# Patient Record
Sex: Male | Born: 1965 | Race: Black or African American | Hispanic: No | Marital: Married | Smoking: Never smoker
Health system: Southern US, Community
[De-identification: ages and names within clinical notes are randomized; demographics above are authoritative.]

## PROBLEM LIST (undated history)

## (undated) DIAGNOSIS — F419 Anxiety disorder, unspecified: Secondary | ICD-10-CM

## (undated) DIAGNOSIS — N400 Enlarged prostate without lower urinary tract symptoms: Secondary | ICD-10-CM

## (undated) DIAGNOSIS — T7840XA Allergy, unspecified, initial encounter: Secondary | ICD-10-CM

## (undated) DIAGNOSIS — G43909 Migraine, unspecified, not intractable, without status migrainosus: Secondary | ICD-10-CM

## (undated) DIAGNOSIS — K219 Gastro-esophageal reflux disease without esophagitis: Secondary | ICD-10-CM

## (undated) HISTORY — DX: Anxiety disorder, unspecified: F41.9

## (undated) HISTORY — DX: Allergy, unspecified, initial encounter: T78.40XA

## (undated) HISTORY — PX: OTHER SURGICAL HISTORY: SHX169

## (undated) HISTORY — DX: Gastro-esophageal reflux disease without esophagitis: K21.9

---

## 2010-01-18 ENCOUNTER — Emergency Department (HOSPITAL_COMMUNITY): Admission: EM | Admit: 2010-01-18 | Discharge: 2010-01-18 | Payer: Self-pay | Admitting: Emergency Medicine

## 2010-01-18 IMAGING — US US ABDOMEN COMPLETE
1 series · 13 of 25 positions shown · non-contrast
Comparison: None.

CLINICAL DATA: Right upper quadrant and epigastric abdominal pain.

COMPLETE ABDOMINAL ULTRASOUND [DATE]:

[Series 1: us abdomen complete · 0.32mm/px · 13 of 84 slices shown]
[im 1/84]
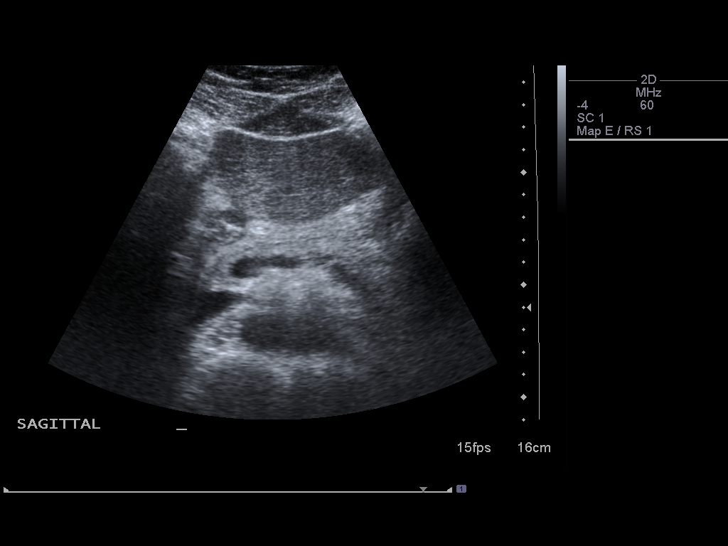
[im 7/84]
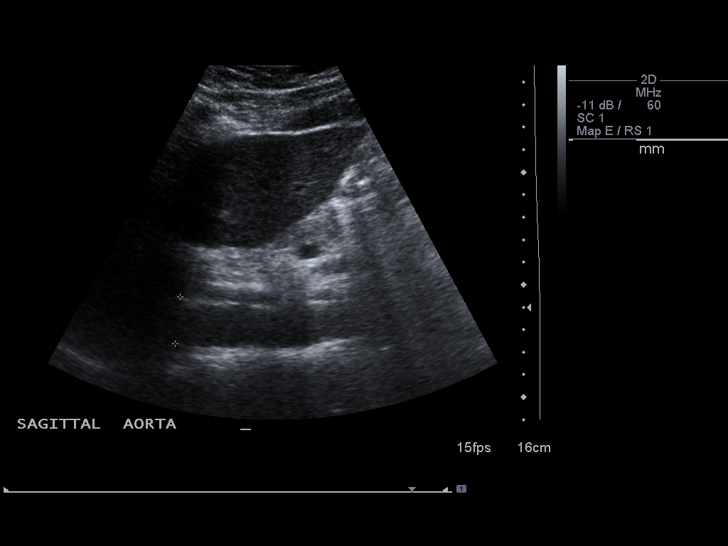
[im 14/84]
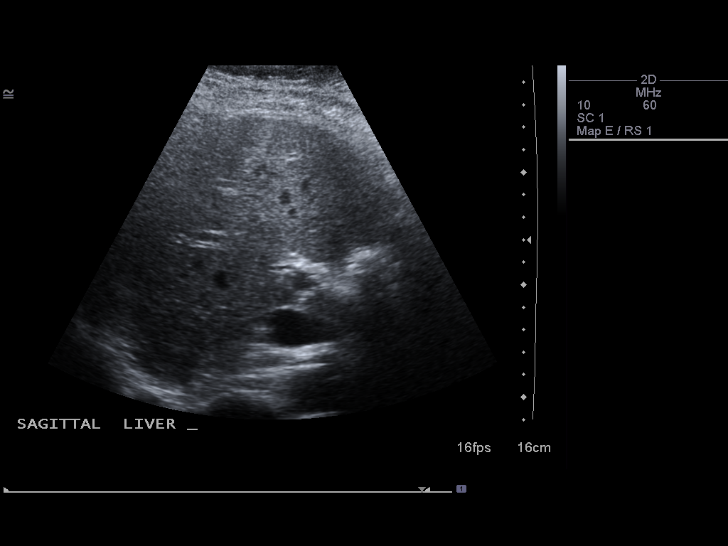
[im 21/84]
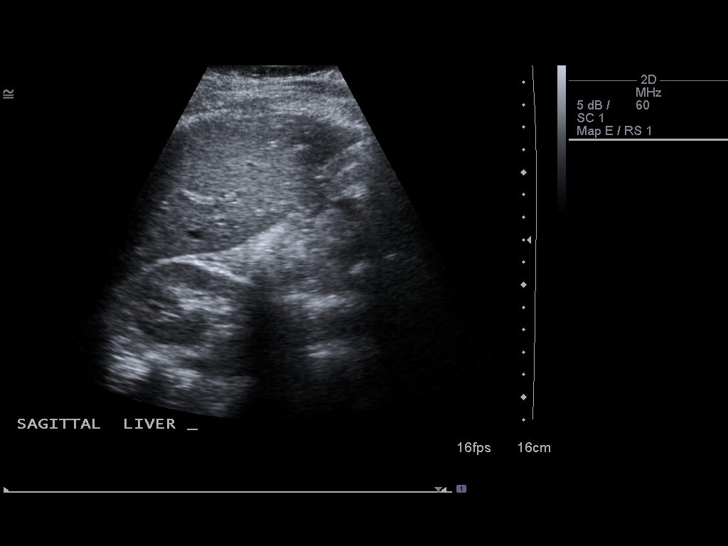
[im 28/84]
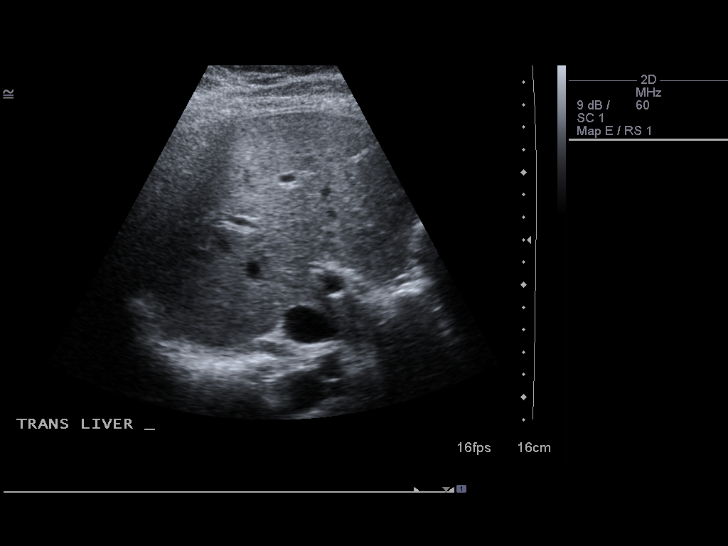
[im 35/84]
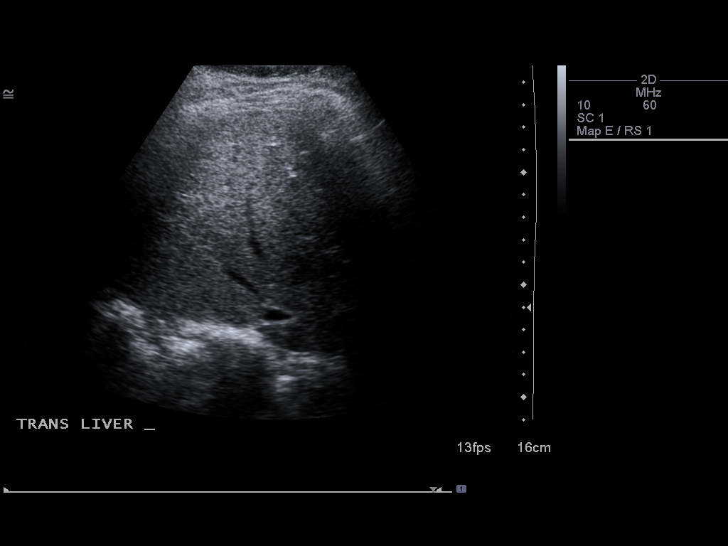
[im 42/84]
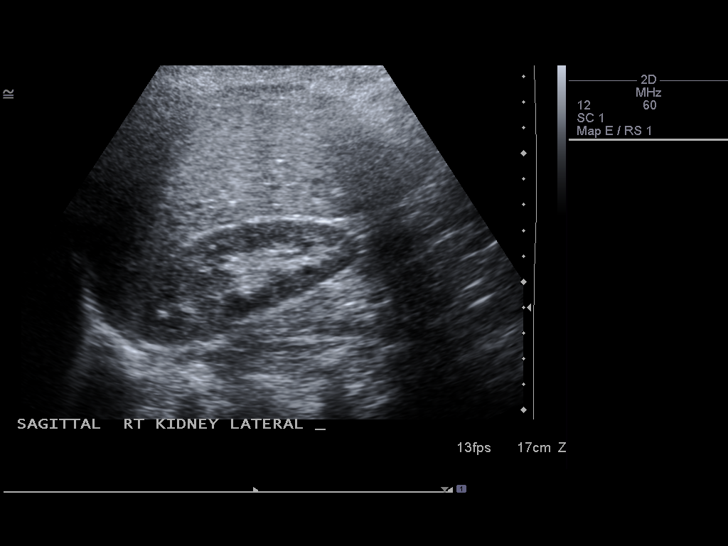
[im 49/84]
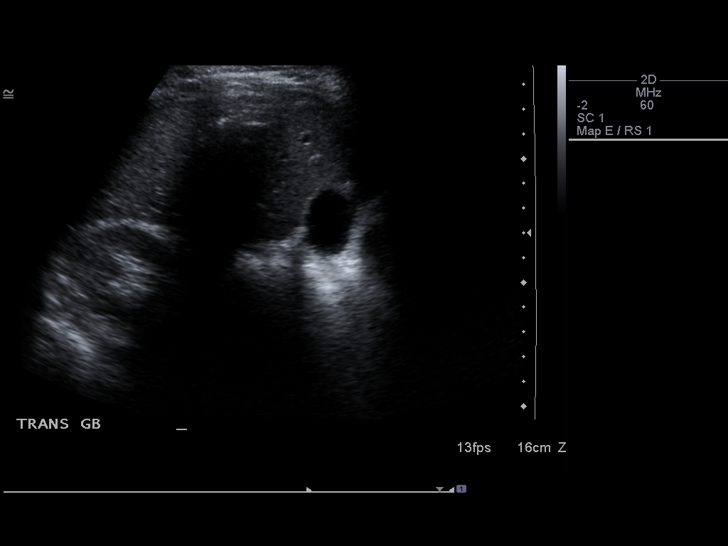
[im 56/84]
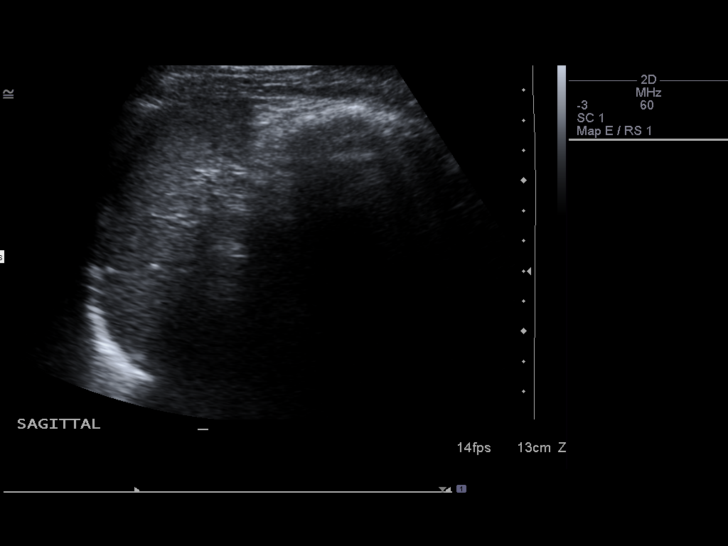
[im 63/84]
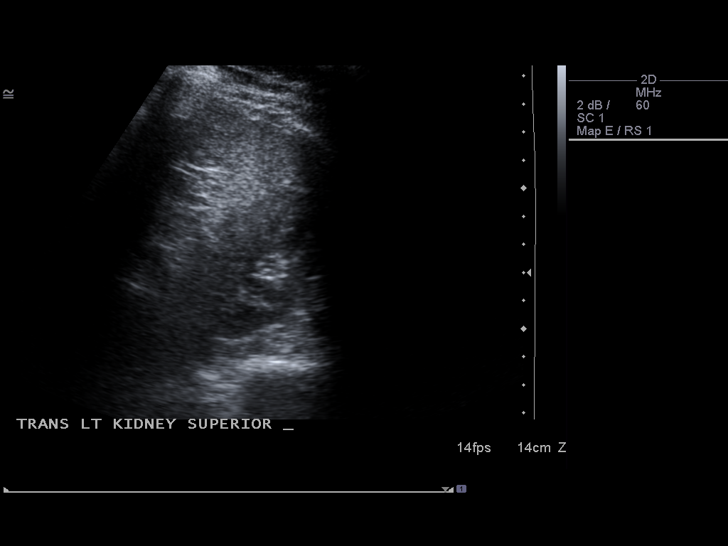
[im 70/84]
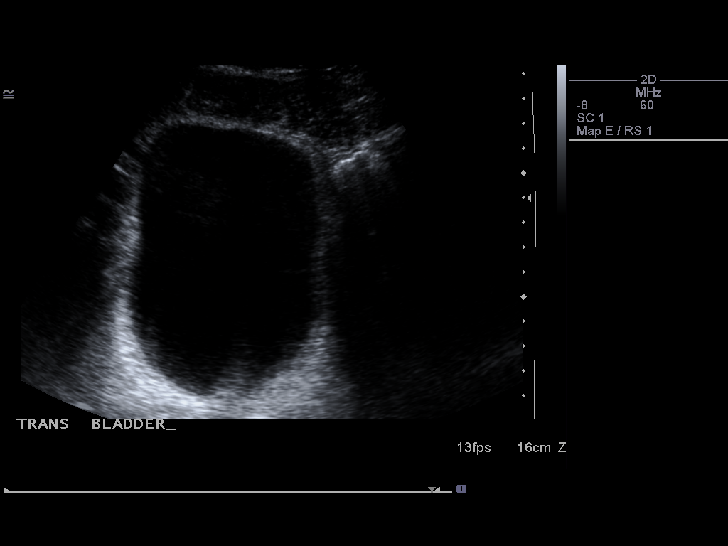
[im 77/84]
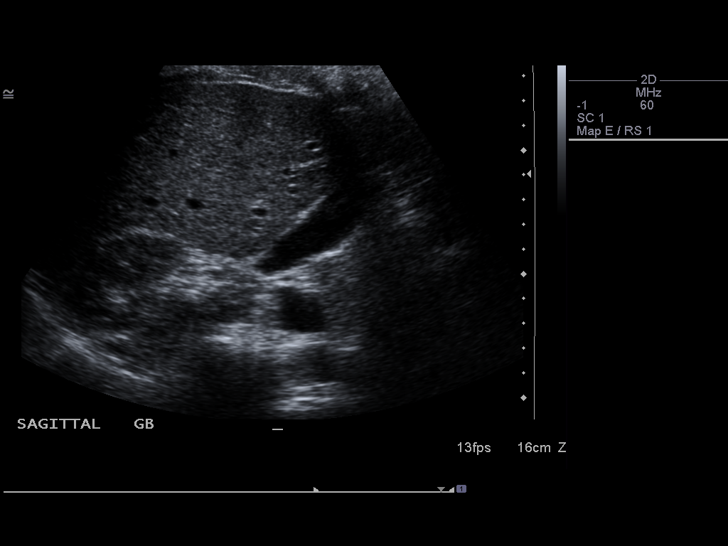
[im 84/84]
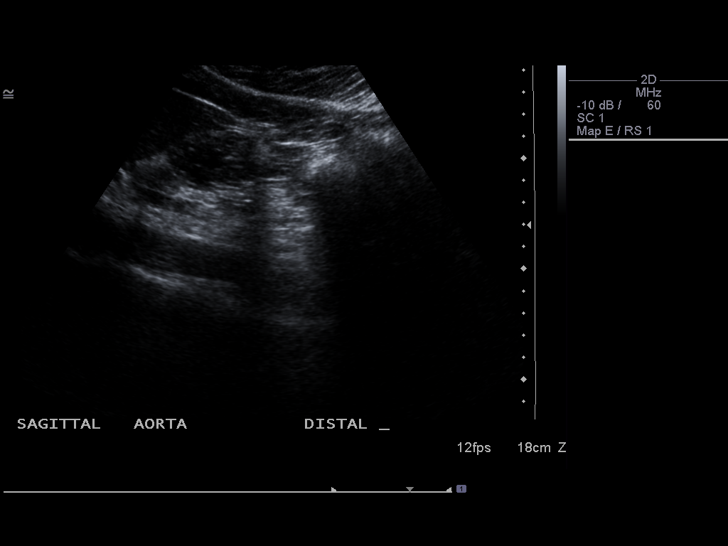

[13 of 25 positions shown; findings below may reference images not displayed]

FINDINGS: Gallbladder:  No shadowing gallstones or echogenic sludge.  No
gallbladder wall thickening or pericholecystic fluid.  Negative
sonographic Murphy's sign according to the ultrasound technologist.

Common bile duct:  Normal in caliber with maximum diameter
approximating 4 mm.

Liver:  Scattered areas of increased and coarsened echotexture
consistent with focal areas of fatty infiltration.  No focal
hepatic parenchymal abnormalities otherwise.  Patent portal vein
with hepatopetal flow.

IVC:  Patent.

Pancreas:  Normal size and echotexture without focal parenchymal
abnormality.

Spleen:  Normal size and echotexture without focal parenchymal
abnormality.

Right Kidney:  No hydronephrosis.  Well-preserved cortex.  Normal
size and parenchymal echotexture without focal abnormalities.
Approximately 10.3 cm length.

Left Kidney:  Initial imaging with a full, distended bladder showed
mild fullness in the collecting system.  Repeat imaging after
voiding showed no hydronephrosis.  Well-preserved cortex.  Normal
size and parenchymal echotexture without focal abnormalities.
Approximately 11.3 cm length.

Abdominal aorta:  Normal in caliber throughout its visualized
course in the abdomen.
IMPRESSION: 1.  Focal areas of fatty infiltration in the liver.
2.  Otherwise normal abdominal ultrasound.

## 2010-07-14 ENCOUNTER — Ambulatory Visit: Payer: Self-pay | Admitting: Family Medicine

## 2010-07-14 ENCOUNTER — Encounter (INDEPENDENT_AMBULATORY_CARE_PROVIDER_SITE_OTHER): Payer: Self-pay | Admitting: Family Medicine

## 2010-07-14 LAB — CONVERTED CEMR LAB
ALT: 20 units/L (ref 0–53)
Basophils Absolute: 0 10*3/uL (ref 0.0–0.1)
CO2: 26 meq/L (ref 19–32)
Calcium: 9.3 mg/dL (ref 8.4–10.5)
Chloride: 104 meq/L (ref 96–112)
Cholesterol: 218 mg/dL — ABNORMAL HIGH (ref 0–200)
Creatinine, Ser: 0.95 mg/dL (ref 0.40–1.50)
Eosinophils Relative: 3 % (ref 0–5)
Glucose, Bld: 103 mg/dL — ABNORMAL HIGH (ref 70–99)
HCT: 40.6 % (ref 39.0–52.0)
Hemoglobin: 13.6 g/dL (ref 13.0–17.0)
Lymphocytes Relative: 33 % (ref 12–46)
Lymphs Abs: 2.2 10*3/uL (ref 0.7–4.0)
Monocytes Absolute: 0.6 10*3/uL (ref 0.1–1.0)
Monocytes Relative: 9 % (ref 3–12)
Neutro Abs: 3.6 10*3/uL (ref 1.7–7.7)
RBC: 4.38 M/uL (ref 4.22–5.81)
RDW: 12.8 % (ref 11.5–15.5)
Total CHOL/HDL Ratio: 5.3
Total Protein: 7.2 g/dL (ref 6.0–8.3)
Triglycerides: 184 mg/dL — ABNORMAL HIGH (ref <150)
WBC: 6.5 10*3/uL (ref 4.0–10.5)

## 2010-07-15 ENCOUNTER — Encounter (INDEPENDENT_AMBULATORY_CARE_PROVIDER_SITE_OTHER): Payer: Self-pay | Admitting: Family Medicine

## 2010-07-15 LAB — CONVERTED CEMR LAB: Hgb A1c MFr Bld: 5.8 % — ABNORMAL HIGH (ref <5.7)

## 2010-09-14 ENCOUNTER — Encounter (INDEPENDENT_AMBULATORY_CARE_PROVIDER_SITE_OTHER): Payer: Self-pay | Admitting: Family Medicine

## 2011-03-13 LAB — DIFFERENTIAL
Eosinophils Absolute: 0.2 10*3/uL (ref 0.0–0.7)
Eosinophils Relative: 2 % (ref 0–5)
Lymphocytes Relative: 28 % (ref 12–46)
Lymphs Abs: 2.3 10*3/uL (ref 0.7–4.0)
Monocytes Absolute: 0.8 10*3/uL (ref 0.1–1.0)

## 2011-03-13 LAB — URINALYSIS, ROUTINE W REFLEX MICROSCOPIC
Ketones, ur: NEGATIVE mg/dL
Nitrite: NEGATIVE
Protein, ur: NEGATIVE mg/dL
Specific Gravity, Urine: 1.007 (ref 1.005–1.030)
pH: 6 (ref 5.0–8.0)

## 2011-03-13 LAB — CBC
HCT: 39.6 % (ref 39.0–52.0)
Hemoglobin: 13.6 g/dL (ref 13.0–17.0)
MCV: 94.2 fL (ref 78.0–100.0)
Platelets: 243 10*3/uL (ref 150–400)
WBC: 8 10*3/uL (ref 4.0–10.5)

## 2011-03-13 LAB — COMPREHENSIVE METABOLIC PANEL
ALT: 30 U/L (ref 0–53)
AST: 25 U/L (ref 0–37)
Albumin: 4 g/dL (ref 3.5–5.2)
Alkaline Phosphatase: 67 U/L (ref 39–117)
Potassium: 3.7 mEq/L (ref 3.5–5.1)
Sodium: 138 mEq/L (ref 135–145)
Total Protein: 7.6 g/dL (ref 6.0–8.3)

## 2014-01-16 ENCOUNTER — Ambulatory Visit
Admission: RE | Admit: 2014-01-16 | Discharge: 2014-01-16 | Disposition: A | Discharge status: Home / Self Care | Payer: BC Managed Care – PPO | Source: Ambulatory Visit | Attending: Family Medicine | Admitting: Family Medicine

## 2014-01-16 ENCOUNTER — Other Ambulatory Visit: Payer: Self-pay | Admitting: Family Medicine

## 2014-01-16 DIAGNOSIS — M79671 Pain in right foot: Secondary | ICD-10-CM

## 2014-01-16 IMAGING — CR DG FOOT COMPLETE 3+V*R*
3 series · 3 of 3 positions shown · non-contrast
Comparison: None.

CLINICAL DATA: Right foot pain.  Heel spur.

EXAM:
RIGHT FOOT COMPLETE - 3+ VIEW

[view not recorded (1 of 3)]
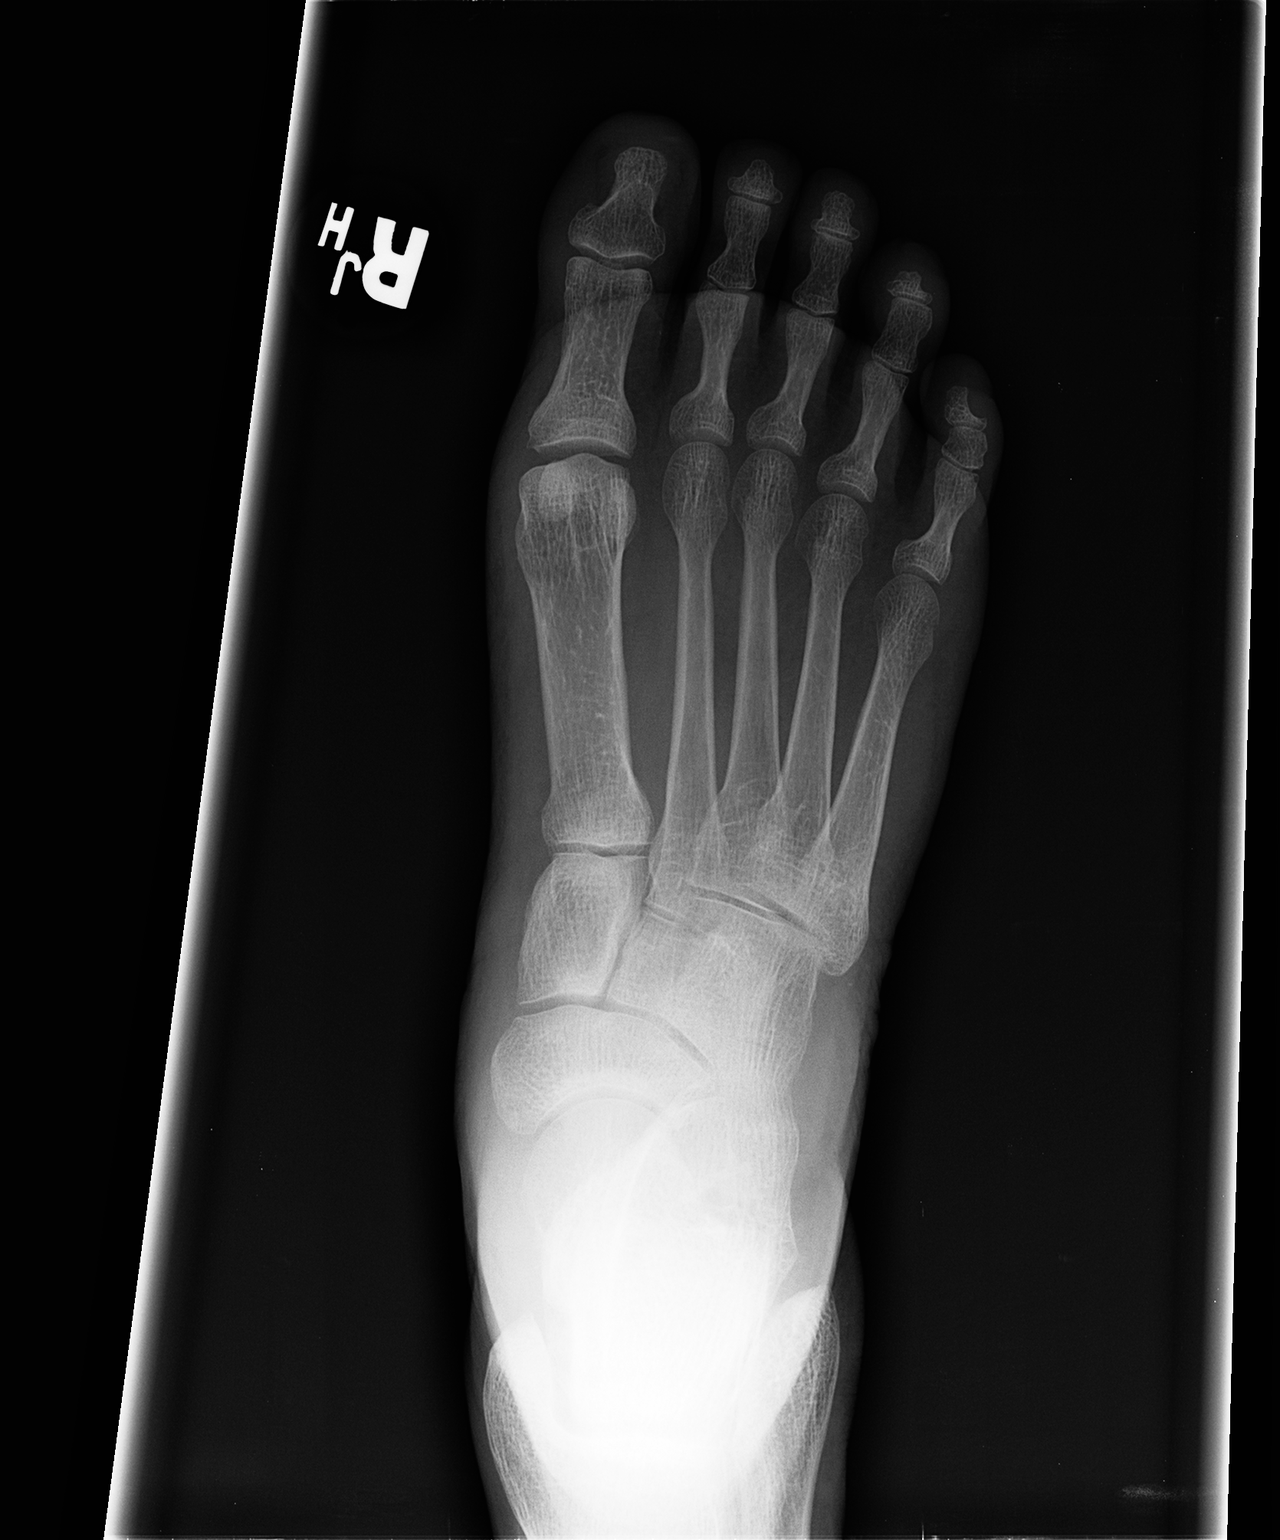

[view not recorded (2 of 3)]
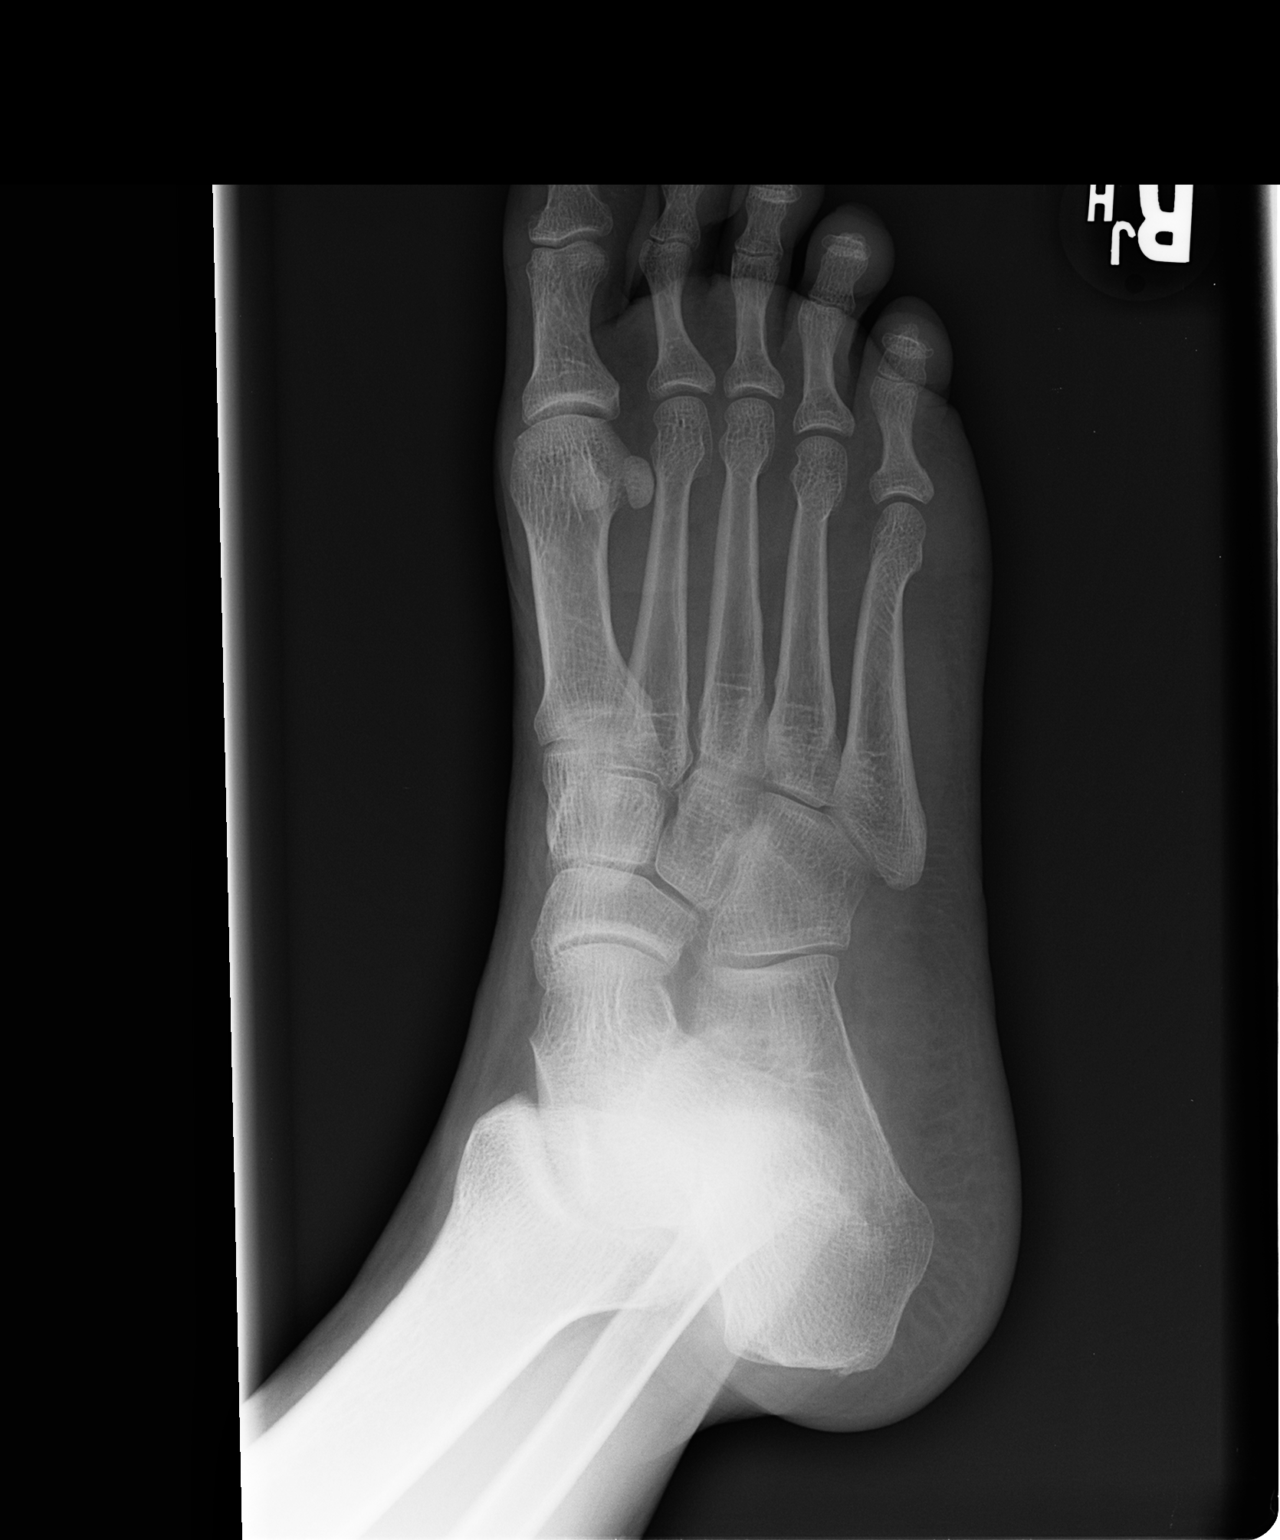

[view not recorded (3 of 3)]
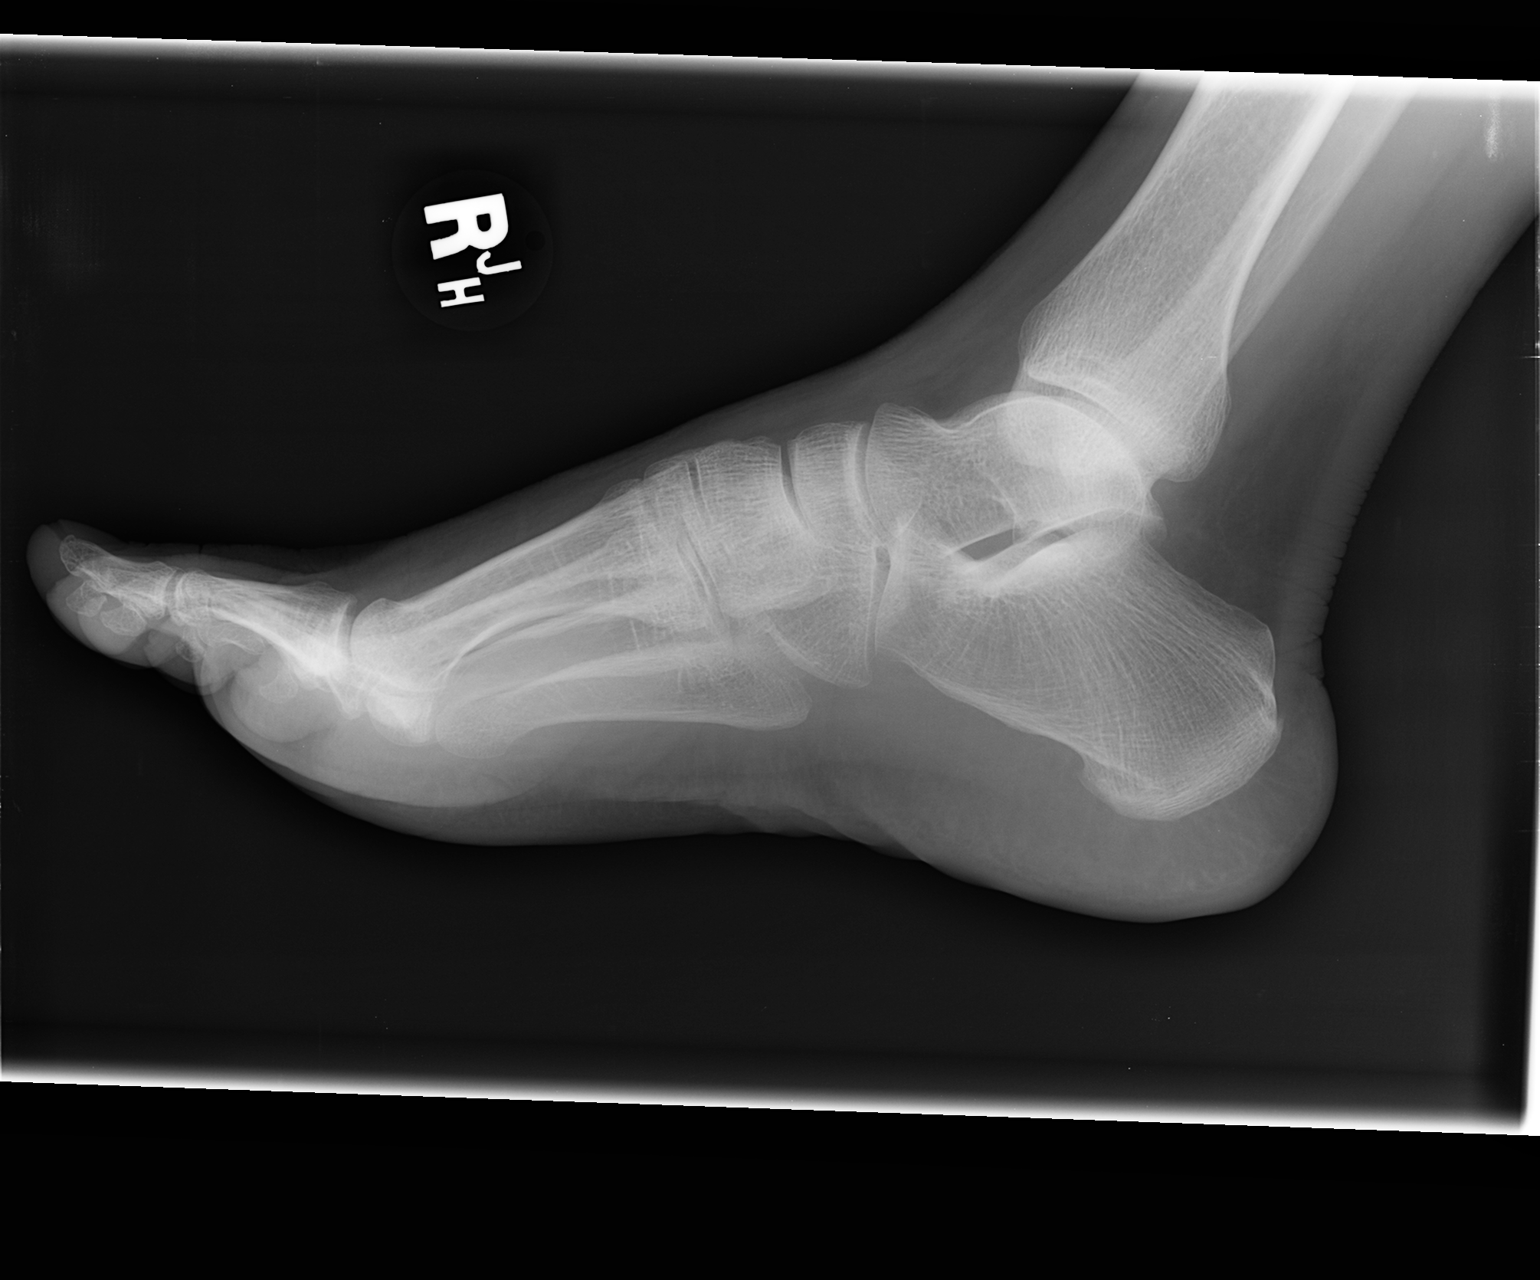

[3 of 3 positions shown; findings below may reference images not displayed]

FINDINGS: There is no evidence of fracture or dislocation. There is no
evidence of arthropathy or other focal bone abnormality. Soft
tissues are unremarkable. No heel spur noted.
IMPRESSION: Negative.

## 2018-03-28 ENCOUNTER — Encounter (HOSPITAL_COMMUNITY): Payer: Self-pay

## 2018-03-28 ENCOUNTER — Emergency Department (HOSPITAL_COMMUNITY)
Admission: EM | Admit: 2018-03-28 | Discharge: 2018-03-28 | Disposition: A | Discharge status: Home / Self Care | Payer: Self-pay | Attending: Emergency Medicine | Admitting: Emergency Medicine

## 2018-03-28 DIAGNOSIS — R55 Syncope and collapse: Secondary | ICD-10-CM | POA: Insufficient documentation

## 2018-03-28 DIAGNOSIS — Z5321 Procedure and treatment not carried out due to patient leaving prior to being seen by health care provider: Secondary | ICD-10-CM | POA: Insufficient documentation

## 2018-03-28 HISTORY — DX: Migraine, unspecified, not intractable, without status migrainosus: G43.909

## 2018-03-28 LAB — CBC
HEMATOCRIT: 41.2 % (ref 39.0–52.0)
HEMOGLOBIN: 13.7 g/dL (ref 13.0–17.0)
MCH: 30.8 pg (ref 26.0–34.0)
MCHC: 33.3 g/dL (ref 30.0–36.0)
MCV: 92.6 fL (ref 78.0–100.0)
Platelets: 257 10*3/uL (ref 150–400)
RBC: 4.45 MIL/uL (ref 4.22–5.81)
RDW: 13.2 % (ref 11.5–15.5)
WBC: 6.5 10*3/uL (ref 4.0–10.5)

## 2018-03-28 LAB — BASIC METABOLIC PANEL
ANION GAP: 6 (ref 5–15)
BUN: 13 mg/dL (ref 6–20)
CO2: 25 mmol/L (ref 22–32)
Calcium: 9.2 mg/dL (ref 8.9–10.3)
Chloride: 106 mmol/L (ref 101–111)
Creatinine, Ser: 0.92 mg/dL (ref 0.61–1.24)
GFR calc Af Amer: >60 mL/min (ref >60)
GFR calc non Af Amer: >60 mL/min (ref >60)
GLUCOSE: 126 mg/dL — AB (ref 65–99)
POTASSIUM: 3.5 mmol/L (ref 3.5–5.1)
Sodium: 137 mmol/L (ref 135–145)

## 2018-03-28 NOTE — ED Notes (Signed)
[facility] asking how long his wait will be, instructed what the current wait time is and pt reports that he is leaving and will see his primary care doctor tomorrow. Pt instructed that we are doing all that we can to ensure he is seen by an emergency department provider, educated about the risks of leaving without seeing a provider and verbalized understanding of those risks. Pt ambulated gait steady out of the waiting room.

## 2018-03-28 NOTE — ED Triage Notes (Signed)
Pt reports that since 530 this evening he has felt dizziness, room spinning and several near syncopal episode. Reports tongue was tingling earlier and has not resolved, neuro intact, hx of migraines.

## 2018-04-01 ENCOUNTER — Ambulatory Visit (INDEPENDENT_AMBULATORY_CARE_PROVIDER_SITE_OTHER): Payer: BLUE CROSS/BLUE SHIELD

## 2018-04-01 ENCOUNTER — Encounter: Payer: Self-pay | Admitting: Podiatry

## 2018-04-01 ENCOUNTER — Ambulatory Visit (INDEPENDENT_AMBULATORY_CARE_PROVIDER_SITE_OTHER): Payer: BLUE CROSS/BLUE SHIELD | Admitting: Podiatry

## 2018-04-01 VITALS — BP 123/85 | HR 79 | Resp 16

## 2018-04-01 DIAGNOSIS — M722 Plantar fascial fibromatosis: Secondary | ICD-10-CM

## 2018-04-01 MED ORDER — MELOXICAM 15 MG PO TABS: 15.0000 mg | ORAL_TABLET | Freq: Every day | ORAL | 0 refills | Status: AC

## 2018-04-01 NOTE — Patient Instructions (Signed)

## 2018-04-05 DIAGNOSIS — M722 Plantar fascial fibromatosis: Secondary | ICD-10-CM | POA: Insufficient documentation

## 2018-04-05 NOTE — Progress Notes (Signed)
Subjective:   Patient ID: Stephen Mejia, male   DOB: 52 y.o.   MRN: [id number]   HPI 52 year old male presents the office with concerns of left foot pain on the bottom of the heel is been ongoing for the last 7 months.  He he states that the pain is worse after he has been walking he stops and sits and stands back up.  He has tried some over-the-counter inserts as well as naproxen which helps some but is not limited his symptoms.  He denies any recent injury or trauma he denies any swelling or redness.  The pain does not wake him up at night.  No numbness or tingling.  He has no other concerns today.   Review of Systems  All other systems reviewed and are negative.  Past Medical History:  Diagnosis Date  . Migraine     History reviewed. No pertinent surgical history.   Current Outpatient Medications:  .  clotrimazole-betamethasone (LOTRISONE) cream, , Disp: , Rfl:  .  meclizine (ANTIVERT) 25 MG tablet, , Disp: , Rfl: 2 .  meloxicam (MOBIC) 15 MG tablet, Take 1 tablet (15 mg total) by mouth daily., Disp: 30 tablet, Rfl: 0  Allergies  Allergen Reactions  . Tramadol     dizziness     Social History   Socioeconomic History  . Marital status: Married    Spouse name: Not on file  . Number of children: Not on file  . Years of education: Not on file  . Highest education level: Not on file  Occupational History  . Not on file  Social Needs  . Financial resource strain: Not on file  . Food insecurity:    Worry: Not on file    Inability: Not on file  . Transportation needs:    Medical: Not on file    Non-medical: Not on file  Tobacco Use  . Smoking status: Never Smoker  . Smokeless tobacco: Never Used  Substance and Sexual Activity  . Alcohol use: Never    Frequency: Never  . Drug use: Never  . Sexual activity: Not on file  Lifestyle  . Physical activity:    Days per week: Not on file    Minutes per session: Not on file  . Stress: Not on file  Relationships  .  Social connections:    Talks on phone: Not on file    Gets together: Not on file    Attends religious service: Not on file    Active member of club or organization: Not on file    Attends meetings of clubs or organizations: Not on file    Relationship status: Not on file  . Intimate partner violence:    Fear of current or ex partner: Not on file    Emotionally abused: Not on file    Physically abused: Not on file    Forced sexual activity: Not on file  Other Topics Concern  . Not on file  Social History Narrative  . Not on file         Objective:  Physical Exam  General: AAO x3, NAD  Dermatological: Skin is warm, dry and supple bilateral. Nails x 10 are well manicured; remaining integument appears unremarkable at this time. There are no open sores, no preulcerative lesions, no rash or signs of infection present.  Vascular: Dorsalis Pedis artery and Posterior Tibial artery pedal pulses are 2/4 bilateral with immedate capillary fill time. Pedal hair growth present. No varicosities and no lower extremity  edema present bilateral. There is no pain with calf compression, swelling, warmth, erythema.   Neruologic: Grossly intact via light touch bilateral.Protective threshold with Semmes Wienstein monofilament intact to all pedal sites bilateral. Negative tinel sign.   Musculoskeletal: Tenderness to palpation along the plantar medial tubercle of the calcaneus at the insertion of plantar fascia on the left foot. There is no pain along the course of the plantar fascia within the arch of the foot. Plantar fascia appears to be intact. There is no pain with lateral compression of the calcaneus or pain with vibratory sensation. There is no pain along the course or insertion of the achilles tendon. No other areas of tenderness to bilateral lower extremities.Muscular strength 5/5 in all groups tested bilateral.  Gait: Unassisted, Nonantalgic.       Assessment:   52 year old male with left heel  pain, likely plantar fasciitis    Plan:  -Treatment options discussed including all alternatives, risks, and complications -Etiology of symptoms were discussed -X-rays were obtained and reviewed with the patient.  No definitive evidence of acute fracture or stress fracture identified. -Declines steroid injection -Prescribed mobic. Discussed side effects of the medication and directed to stop if any are to occur and call the office.  -Stretching, icing exercises daily. -Night splint -Plantar fascial brace was dispensed -Discussed shoe modifications and orthotics.  And check orthotic coverage. -RTC 3-4 weeks or sooner if needed  Trula Slade DPM

## 2018-04-22 ENCOUNTER — Ambulatory Visit (INDEPENDENT_AMBULATORY_CARE_PROVIDER_SITE_OTHER): Payer: BLUE CROSS/BLUE SHIELD | Admitting: Podiatry

## 2018-04-22 DIAGNOSIS — M722 Plantar fascial fibromatosis: Secondary | ICD-10-CM | POA: Diagnosis not present

## 2018-04-22 NOTE — Progress Notes (Signed)
Subjective: 52 year old male presents the office today for follow-up evaluation of left heel pain, plantar fasciitis.  He states he is happy because the pain is much improved.  He still gets some discomfort but not as bad as what it was.  He has been stretching icing on a regular basis.He does take anti-inflammatory intermittently.  He denies any recent injury or trauma or any changes.  He has no new concerns. Denies any systemic complaints such as fevers, chills, nausea, vomiting. No acute changes since last appointment, and no other complaints at this time.   Objective: AAO x3, NAD DP/PT pulses palpable bilaterally, CRT less than 3 seconds At this time there is decreased tenderness palpation on the plantar medial tubercle of the calcaneus at the insertion of plantar fascia of the left foot.  Plantar fascia appears to be intact.  Achilles tendon intact without any pain.  There is no pain with lateral compression of the calcaneus.  No overlying edema, erythema, increase in warmth.  Negative Tinel sign. No open lesions or pre-ulcerative lesions.  No pain with calf compression, swelling, warmth, erythema  Assessment: Resolving left heel pain, plantar fasciitis  Plan:  -All treatment options discussed with the patient including all alternatives, risks, complications.  -Declines steroid injection.  Continue with stretching, icing exercises daily.  Also power steps were dispensed today.  Discussed shoe modifications as well.  Anti-inflammatories as needed.  He agrees with this plan has no further questions -Patient encouraged to call the office with any questions, concerns, change in symptoms.   Trula Slade DPM

## 2018-12-21 ENCOUNTER — Ambulatory Visit
Admission: EM | Admit: 2018-12-21 | Discharge: 2018-12-21 | Disposition: A | Discharge status: Home / Self Care | Payer: BLUE CROSS/BLUE SHIELD | Attending: Family Medicine | Admitting: Family Medicine

## 2018-12-21 ENCOUNTER — Other Ambulatory Visit: Payer: Self-pay

## 2018-12-21 ENCOUNTER — Encounter: Payer: Self-pay | Admitting: Gynecology

## 2018-12-21 DIAGNOSIS — J069 Acute upper respiratory infection, unspecified: Secondary | ICD-10-CM | POA: Diagnosis not present

## 2018-12-21 DIAGNOSIS — B9789 Other viral agents as the cause of diseases classified elsewhere: Secondary | ICD-10-CM | POA: Diagnosis not present

## 2018-12-21 LAB — RAPID STREP SCREEN (MED CTR MEBANE ONLY): STREPTOCOCCUS, GROUP A SCREEN (DIRECT): NEGATIVE

## 2018-12-21 MED ORDER — BENZONATATE 100 MG PO CAPS: 100.0000 mg | ORAL_CAPSULE | Freq: Three times a day (TID) | ORAL | 0 refills | Status: DC | PRN

## 2018-12-21 MED ORDER — LIDOCAINE VISCOUS HCL 2 % MT SOLN: OROMUCOSAL | 0 refills | Status: DC

## 2018-12-21 NOTE — ED Triage Notes (Signed)
Patient c/o sore throat x yesterday. Per patient cough and chest burning with cough.

## 2018-12-23 NOTE — ED Provider Notes (Signed)
[facility]    CSN: [id number] Arrival date & time: 12/21/18  1453   History   Chief Complaint Chief Complaint  Patient presents with  . Sore Throat   HPI  52 year old male presents with sore throat.  Patient reports sore throat which started yesterday.  Associated cough and chest tightness.  No fever.  No chills.  No medications or interventions tried.  No known exacerbating factors.  No other associated symptoms.  No other complaints.  History reviewed and updated as below. Past Medical History:  Diagnosis Date  . Migraine    Patient Active Problem List   Diagnosis Date Noted  . Plantar fasciitis of left foot 04/05/2018   History reviewed. No pertinent surgical history.   Home Medications    Prior to Admission medications   Medication Sig Start Date End Date Taking? Authorizing Provider  clotrimazole-betamethasone (LOTRISONE) cream  03/13/18  Yes [provider]  meclizine (ANTIVERT) 25 MG tablet  03/28/18  Yes [provider]  meloxicam (MOBIC) 15 MG tablet Take 1 tablet (15 mg total) by mouth daily. 04/01/18 04/01/19 Yes Trula Slade, DPM  benzonatate (TESSALON) 100 MG capsule Take 1 capsule (100 mg total) by mouth 3 (three) times daily as needed. 12/21/18   Coral Spikes, DO  lidocaine (XYLOCAINE) 2 % solution Gargle 15 mL every 3 hours as needed. May swallow if desired. 12/21/18   Coral Spikes, DO   Social History Social History   Tobacco Use  . Smoking status: Never Smoker  . Smokeless tobacco: Never Used  Substance Use Topics  . Alcohol use: Never    Frequency: Never  . Drug use: Never   Allergies   Tramadol  Review of Systems Review of Systems  Constitutional: Negative for fever.  HENT: Positive for sore throat.   Respiratory: Positive for cough.    Physical Exam Triage Vital Signs ED Triage Vitals  Enc Vitals Group     BP 12/21/18 1541 117/83     Pulse Rate 12/21/18 1541 98     Resp 12/21/18 1541 16     Temp  12/21/18 1541 98.3 F (36.8 C)     Temp Source 12/21/18 1541 Oral     SpO2 12/21/18 1541 99 %     Weight 12/21/18 1538 178 lb (80.7 kg)     Height 12/21/18 1538 5\' 7"  (1.702 m)     Head Circumference --      Peak Flow --      Pain Score 12/21/18 1538 9     Pain Loc --      Pain Edu? --      Excl. in [place]? --    Updated Vital Signs BP 117/83 (BP Location: Left Arm)   Pulse 98   Temp 98.3 F (36.8 C) (Oral)   Resp 16   Ht 5\' 7"  (1.702 m)   Wt 80.7 kg   SpO2 99%   BMI 27.88 kg/m   Visual Acuity Right Eye Distance:   Left Eye Distance:   Bilateral Distance:    Right Eye Near:   Left Eye Near:    Bilateral Near:     Physical Exam Constitutional:      General: He is not in acute distress.    Appearance: Normal appearance.  HENT:     Head: Normocephalic and atraumatic.     Right Ear: Tympanic membrane normal.     Left Ear: Tympanic membrane normal.     Nose: Nose normal.  Mouth/Throat:     Comments: Oropharynx with mild erythema.  No exudate. Cardiovascular:     Rate and Rhythm: Normal rate and regular rhythm.  Pulmonary:     Effort: Pulmonary effort is normal.     Breath sounds: No wheezing, rhonchi or rales.  Neurological:     Mental Status: He is alert.  Psychiatric:        Mood and Affect: Mood normal.        Behavior: Behavior normal.    UC Treatments / Results  Labs (all labs ordered are listed, but only abnormal results are displayed) Labs Reviewed  RAPID STREP SCREEN ([facility] ONLY)  CULTURE, GROUP A STREP [facility])    EKG None  Radiology No results found.  Procedures Procedures (including critical care time)  Medications Ordered in UC Medications - No data to display  Initial Impression / Assessment and Plan / UC Course  I have reviewed the triage vital signs and the nursing notes.  Pertinent labs & imaging results that were available during my care of the patient were reviewed by me and considered in my medical decision making  (see chart for details).    52 year old male presents with viral URI with cough.  Treating with Tessalon Perles and viscous lidocaine.  Final Clinical Impressions(s) / UC Diagnoses   Final diagnoses:  Viral URI with cough   Discharge Instructions   None    ED Prescriptions    Medication Sig Dispense Auth. Provider   lidocaine (XYLOCAINE) 2 % solution Gargle 15 mL every 3 hours as needed. May swallow if desired. 200 mL Larcenia Holaday G, DO   benzonatate (TESSALON) 100 MG capsule Take 1 capsule (100 mg total) by mouth 3 (three) times daily as needed. 30 capsule Coral Spikes, DO     Controlled Substance Prescriptions [place] Controlled Substance Registry consulted? Not Applicable   Coral Spikes, [place] 12/23/18 [id number]

## 2018-12-24 LAB — CULTURE, GROUP A STREP (THRC)

## 2019-03-04 ENCOUNTER — Other Ambulatory Visit: Payer: Self-pay | Admitting: Neurology

## 2019-03-04 DIAGNOSIS — G4459 Other complicated headache syndrome: Secondary | ICD-10-CM

## 2019-03-19 ENCOUNTER — Other Ambulatory Visit: Payer: BLUE CROSS/BLUE SHIELD

## 2019-04-02 ENCOUNTER — Other Ambulatory Visit: Payer: BLUE CROSS/BLUE SHIELD

## 2019-05-21 ENCOUNTER — Other Ambulatory Visit: Payer: BLUE CROSS/BLUE SHIELD

## 2019-05-24 ENCOUNTER — Other Ambulatory Visit: Payer: Self-pay

## 2019-05-24 ENCOUNTER — Ambulatory Visit
Admission: RE | Admit: 2019-05-24 | Discharge: 2019-05-24 | Disposition: A | Discharge status: Home / Self Care | Payer: BLUE CROSS/BLUE SHIELD | Source: Ambulatory Visit | Attending: Neurology | Admitting: Neurology

## 2019-05-24 DIAGNOSIS — G4459 Other complicated headache syndrome: Secondary | ICD-10-CM

## 2019-05-24 IMAGING — MR MRI HEAD WITHOUT CONTRAST
10 series · 48 of 48 positions shown · non-contrast
Comparison: None.

CLINICAL DATA: Chronic migraine headaches. Intermittent dizziness
over the last 6 months.

EXAM:
MRI HEAD WITHOUT CONTRAST
TECHNIQUE: Multiplanar, multiecho pulse sequences of the brain and surrounding
structures were obtained without intravenous contrast.

[Series 5: T1 · sagittal · 4.0mm · 0.75mm/px · 2 of 29 slices shown (1 of 2)]
[im 1/29]
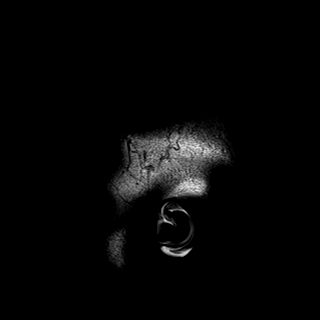
[im 29/29]
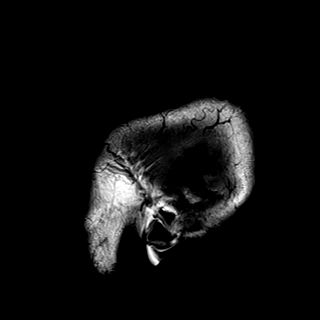

[Series 6: T2 · axial · 4.0mm · 0.36mm/px · z∈[-65,+69]mm · 2 of 27 slices shown (1 of 2)]
[im 1/27]
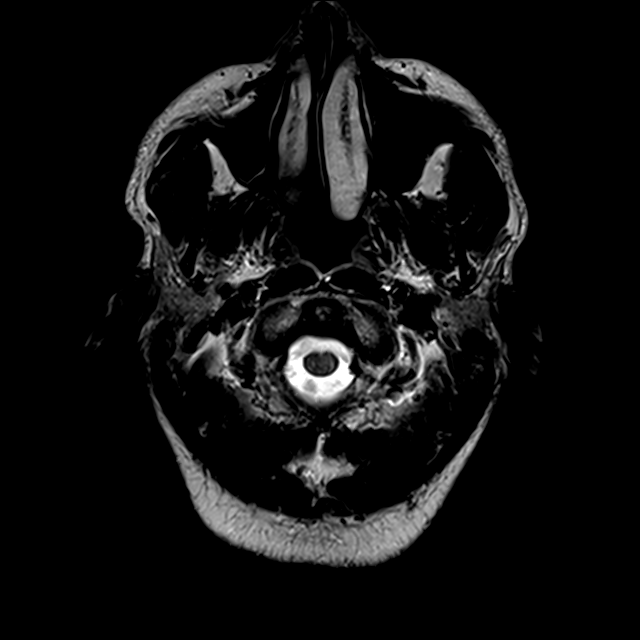
[im 27/27]
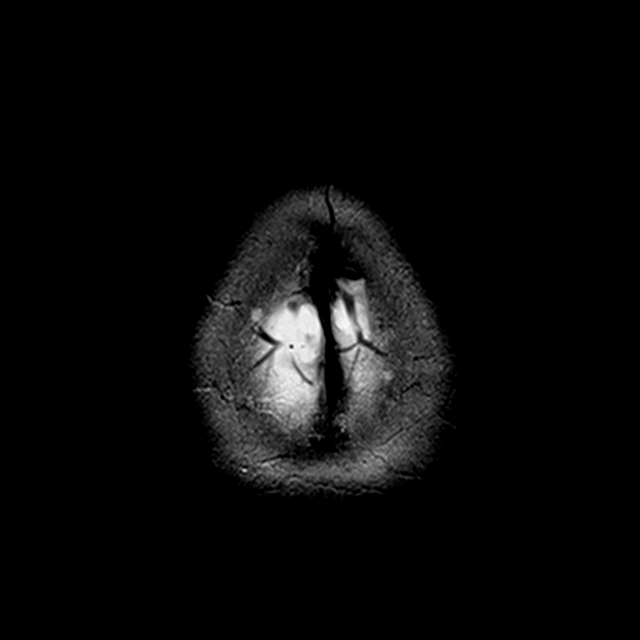

[Series 7: DWI · axial · 3.0mm · 1.44mm/px · z∈[-66,+69]mm · 8 of 84 slices shown (1 of 4)]
[im 1/84]
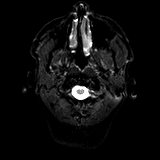
[im 12/84]
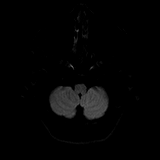
[im 24/84]
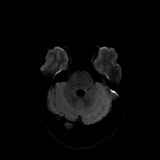
[im 36/84]
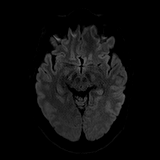
[im 48/84]
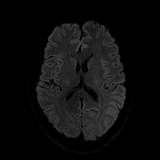
[im 60/84]
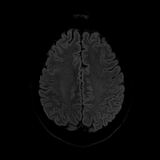
[im 72/84]
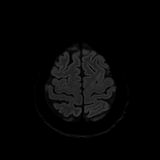
[im 84/84]
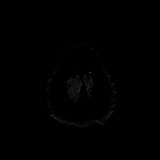

[Series 8: DWI · axial · 3.0mm · 1.44mm/px · z∈[-66,+69]mm · 4 of 42 slices shown (2 of 4)]
[im 1/42]
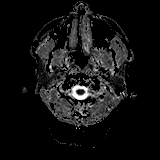
[im 14/42]
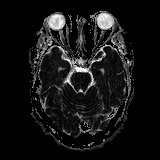
[im 28/42]
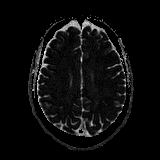
[im 42/42]
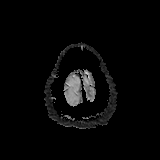

[Series 9: DWI · coronal · 5.0mm · 1.44mm/px · 6 of 60 slices shown (3 of 4)]
[im 1/60]
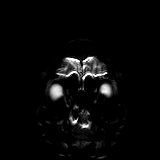
[im 12/60]
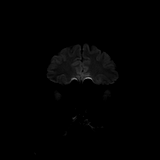
[im 24/60]
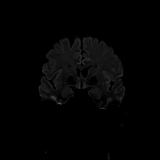
[im 36/60]
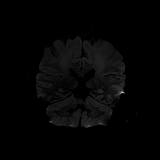
[im 48/60]
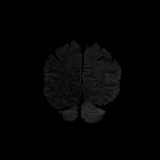
[im 60/60]
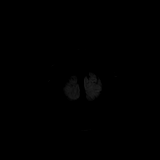

[Series 10: DWI · coronal · 5.0mm · 1.44mm/px · 3 of 30 slices shown (4 of 4)]
[im 1/30]
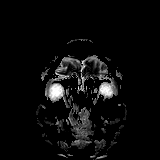
[im 15/30]
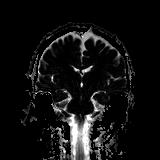
[im 30/30]
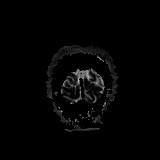

[Series 12: swi_images · axial · 4.0mm · 0.90mm/px · z∈[-68,+71]mm · 3 of 36 slices shown]
[im 1/36]
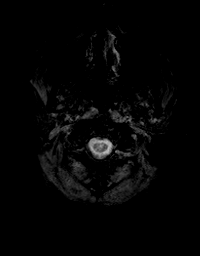
[im 18/36]
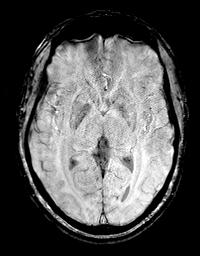
[im 36/36]
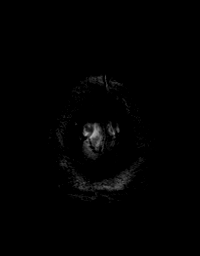

[Series 13: FLAIR · axial · 3.0mm · 0.72mm/px · z∈[-69,+73]mm · 4 of 40 slices shown]
[im 1/40]
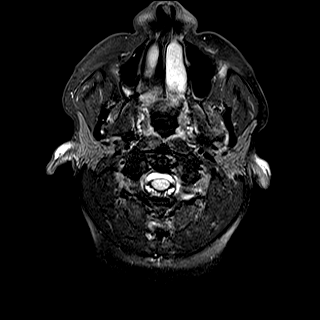
[im 14/40]
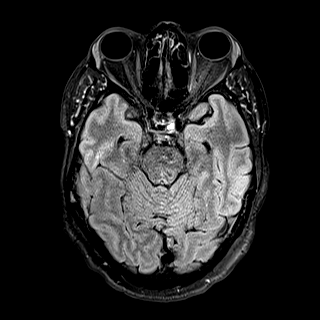
[im 27/40]
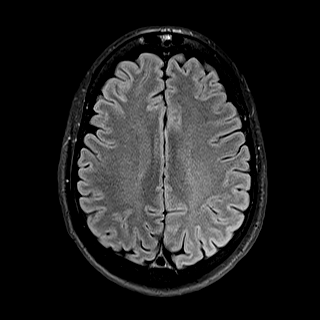
[im 40/40]
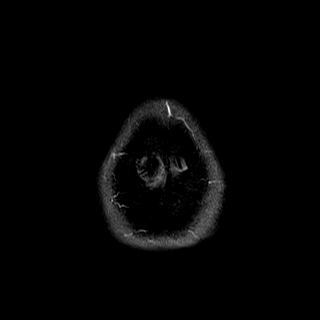

[Series 14: T1 · axial · 1.0mm · 0.90mm/px · z∈[-70,+73]mm · 13 of 144 slices shown (2 of 2)]
[im 1/144]
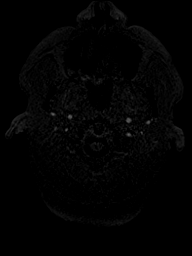
[im 12/144]
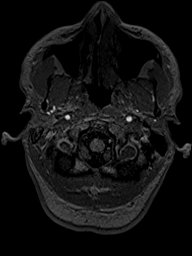
[im 24/144]
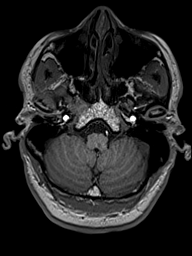
[im 36/144]
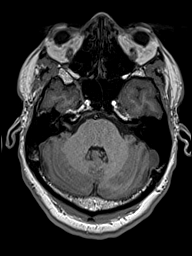
[im 48/144]
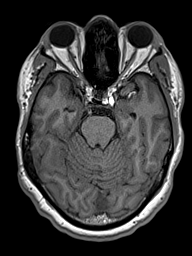
[im 60/144]
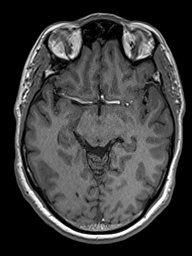
[im 72/144]
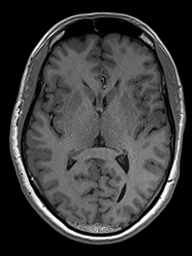
[im 84/144]
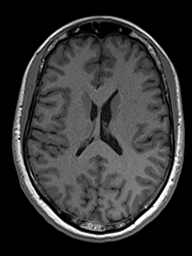
[im 96/144]
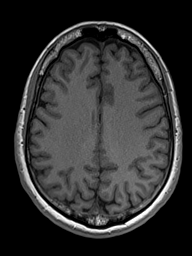
[im 108/144]
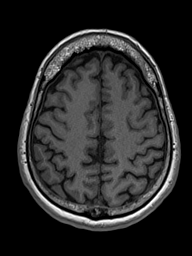
[im 120/144]
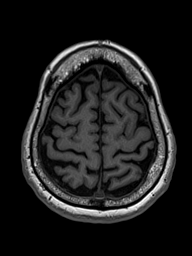
[im 132/144]
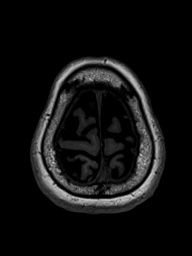
[im 144/144]
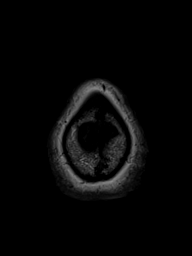

[Series 15: T2 · coronal · 4.5mm · 0.36mm/px · 3 of 30 slices shown (2 of 2)]
[im 1/30]
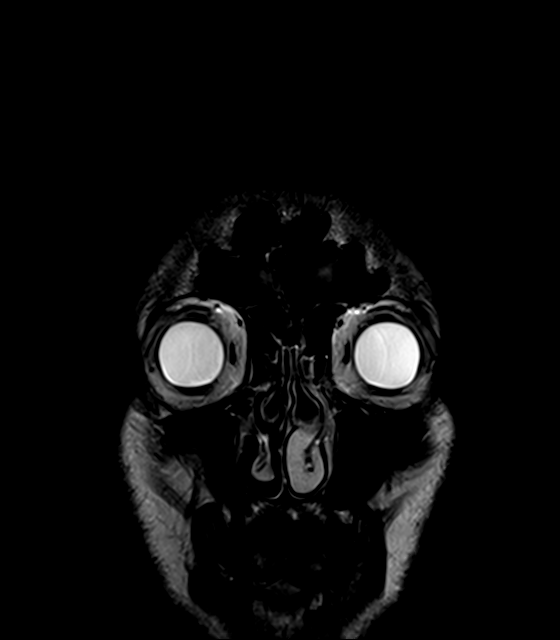
[im 15/30]
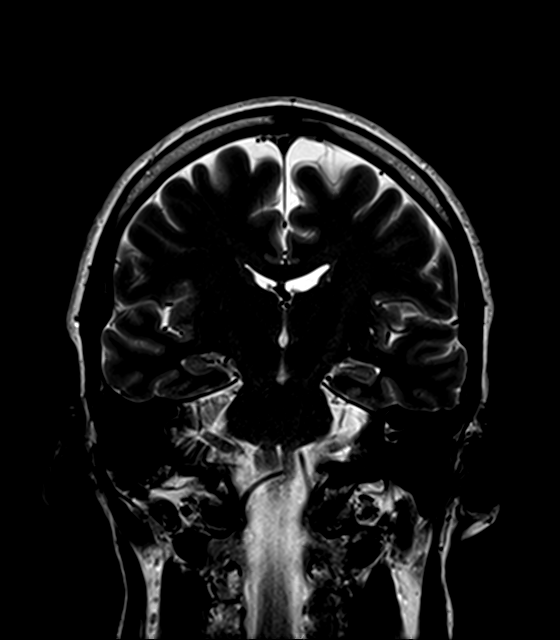
[im 30/30]
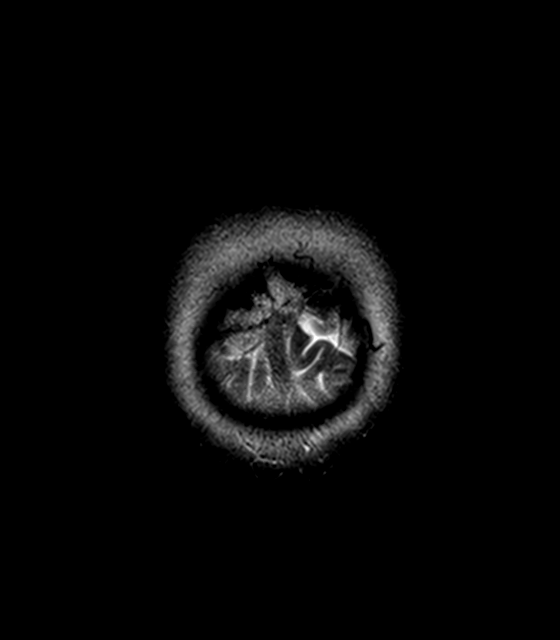

[48 of 48 positions shown; findings below may reference images not displayed]

FINDINGS: Brain: The brain has a normal appearance without evidence of
malformation, atrophy, old or acute small or large vessel
infarction, mass lesion, hemorrhage, hydrocephalus or extra-axial
collection.

Vascular: Major vessels at the base of the brain show flow. Venous
sinuses appear patent.

Skull and upper cervical spine: Normal.

Sinuses/Orbits: Clear/normal.

Other: None significant.
IMPRESSION: Normal examination.  No abnormality seen to explain headache.
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
## 2020-10-21 ENCOUNTER — Emergency Department (HOSPITAL_COMMUNITY): Payer: BC Managed Care – PPO

## 2020-10-21 ENCOUNTER — Encounter (HOSPITAL_COMMUNITY): Payer: Self-pay

## 2020-10-21 ENCOUNTER — Emergency Department (HOSPITAL_COMMUNITY)
Admission: EM | Admit: 2020-10-21 | Discharge: 2020-10-21 | Disposition: A | Discharge status: Home / Self Care | Payer: BC Managed Care – PPO | Attending: Emergency Medicine | Admitting: Emergency Medicine

## 2020-10-21 DIAGNOSIS — M549 Dorsalgia, unspecified: Secondary | ICD-10-CM

## 2020-10-21 DIAGNOSIS — M25562 Pain in left knee: Secondary | ICD-10-CM | POA: Insufficient documentation

## 2020-10-21 DIAGNOSIS — S7002XA Contusion of left hip, initial encounter: Secondary | ICD-10-CM | POA: Insufficient documentation

## 2020-10-21 DIAGNOSIS — Z20822 Contact with and (suspected) exposure to covid-19: Secondary | ICD-10-CM | POA: Insufficient documentation

## 2020-10-21 DIAGNOSIS — M545 Low back pain, unspecified: Secondary | ICD-10-CM | POA: Insufficient documentation

## 2020-10-21 DIAGNOSIS — R1032 Left lower quadrant pain: Secondary | ICD-10-CM | POA: Insufficient documentation

## 2020-10-21 DIAGNOSIS — W108XXA Fall (on) (from) other stairs and steps, initial encounter: Secondary | ICD-10-CM | POA: Insufficient documentation

## 2020-10-21 DIAGNOSIS — M546 Pain in thoracic spine: Secondary | ICD-10-CM | POA: Diagnosis not present

## 2020-10-21 DIAGNOSIS — S299XXA Unspecified injury of thorax, initial encounter: Secondary | ICD-10-CM

## 2020-10-21 DIAGNOSIS — S79912A Unspecified injury of left hip, initial encounter: Secondary | ICD-10-CM | POA: Diagnosis present

## 2020-10-21 HISTORY — DX: Benign prostatic hyperplasia without lower urinary tract symptoms: N40.0

## 2020-10-21 LAB — CBC WITH DIFFERENTIAL/PLATELET
Abs Immature Granulocytes: 0.01 10*3/uL (ref 0.00–0.07)
Basophils Absolute: 0 10*3/uL (ref 0.0–0.1)
Basophils Relative: 1 %
Eosinophils Absolute: 0.1 10*3/uL (ref 0.0–0.5)
Eosinophils Relative: 2 %
HCT: 41.1 % (ref 39.0–52.0)
Hemoglobin: 13.5 g/dL (ref 13.0–17.0)
Immature Granulocytes: 0 %
Lymphocytes Relative: 27 %
Lymphs Abs: 1.6 10*3/uL (ref 0.7–4.0)
MCH: 31.1 pg (ref 26.0–34.0)
MCHC: 32.8 g/dL (ref 30.0–36.0)
MCV: 94.7 fL (ref 80.0–100.0)
Monocytes Absolute: 0.6 10*3/uL (ref 0.1–1.0)
Monocytes Relative: 10 %
Neutro Abs: 3.5 10*3/uL (ref 1.7–7.7)
Neutrophils Relative %: 60 %
Platelets: 224 10*3/uL (ref 150–400)
RBC: 4.34 MIL/uL (ref 4.22–5.81)
RDW: 12.5 % (ref 11.5–15.5)
WBC: 5.8 10*3/uL (ref 4.0–10.5)
nRBC: 0 % (ref 0.0–0.2)

## 2020-10-21 LAB — COMPREHENSIVE METABOLIC PANEL
ALT: 28 U/L (ref 0–44)
AST: 29 U/L (ref 15–41)
Albumin: 3.5 g/dL (ref 3.5–5.0)
Alkaline Phosphatase: 59 U/L (ref 38–126)
Anion gap: 8 (ref 5–15)
BUN: 16 mg/dL (ref 6–20)
CO2: 26 mmol/L (ref 22–32)
Calcium: 9.2 mg/dL (ref 8.9–10.3)
Chloride: 106 mmol/L (ref 98–111)
Creatinine, Ser: 1.17 mg/dL (ref 0.61–1.24)
GFR, Estimated: >60 mL/min (ref >60)
Glucose, Bld: 99 mg/dL (ref 70–99)
Potassium: 4.1 mmol/L (ref 3.5–5.1)
Sodium: 140 mmol/L (ref 135–145)
Total Bilirubin: 0.7 mg/dL (ref 0.3–1.2)
Total Protein: 6.6 g/dL (ref 6.5–8.1)

## 2020-10-21 LAB — URINALYSIS, ROUTINE W REFLEX MICROSCOPIC
Bilirubin Urine: NEGATIVE
Glucose, UA: NEGATIVE mg/dL
Hgb urine dipstick: NEGATIVE
Ketones, ur: NEGATIVE mg/dL
Leukocytes,Ua: NEGATIVE
Nitrite: NEGATIVE
Protein, ur: NEGATIVE mg/dL
Specific Gravity, Urine: 1.023 (ref 1.005–1.030)
pH: 6 (ref 5.0–8.0)

## 2020-10-21 LAB — TYPE AND SCREEN
ABO/RH(D): O POS
Antibody Screen: NEGATIVE

## 2020-10-21 LAB — RESPIRATORY PANEL BY RT PCR (FLU A&B, COVID)
Influenza A by PCR: NEGATIVE
Influenza B by PCR: NEGATIVE
SARS Coronavirus 2 by RT PCR: NEGATIVE

## 2020-10-21 LAB — LACTIC ACID, PLASMA: Lactic Acid, Venous: 1.4 mmol/L (ref 0.5–1.9)

## 2020-10-21 LAB — LIPASE, BLOOD: Lipase: 25 U/L (ref 11–51)

## 2020-10-21 IMAGING — DX DG CHEST 1V PORT
1 series · 1 of 1 positions shown · non-contrast
Comparison: None.

CLINICAL DATA: Fell down stairs.

EXAM:
PORTABLE CHEST 1 VIEW

[chest]
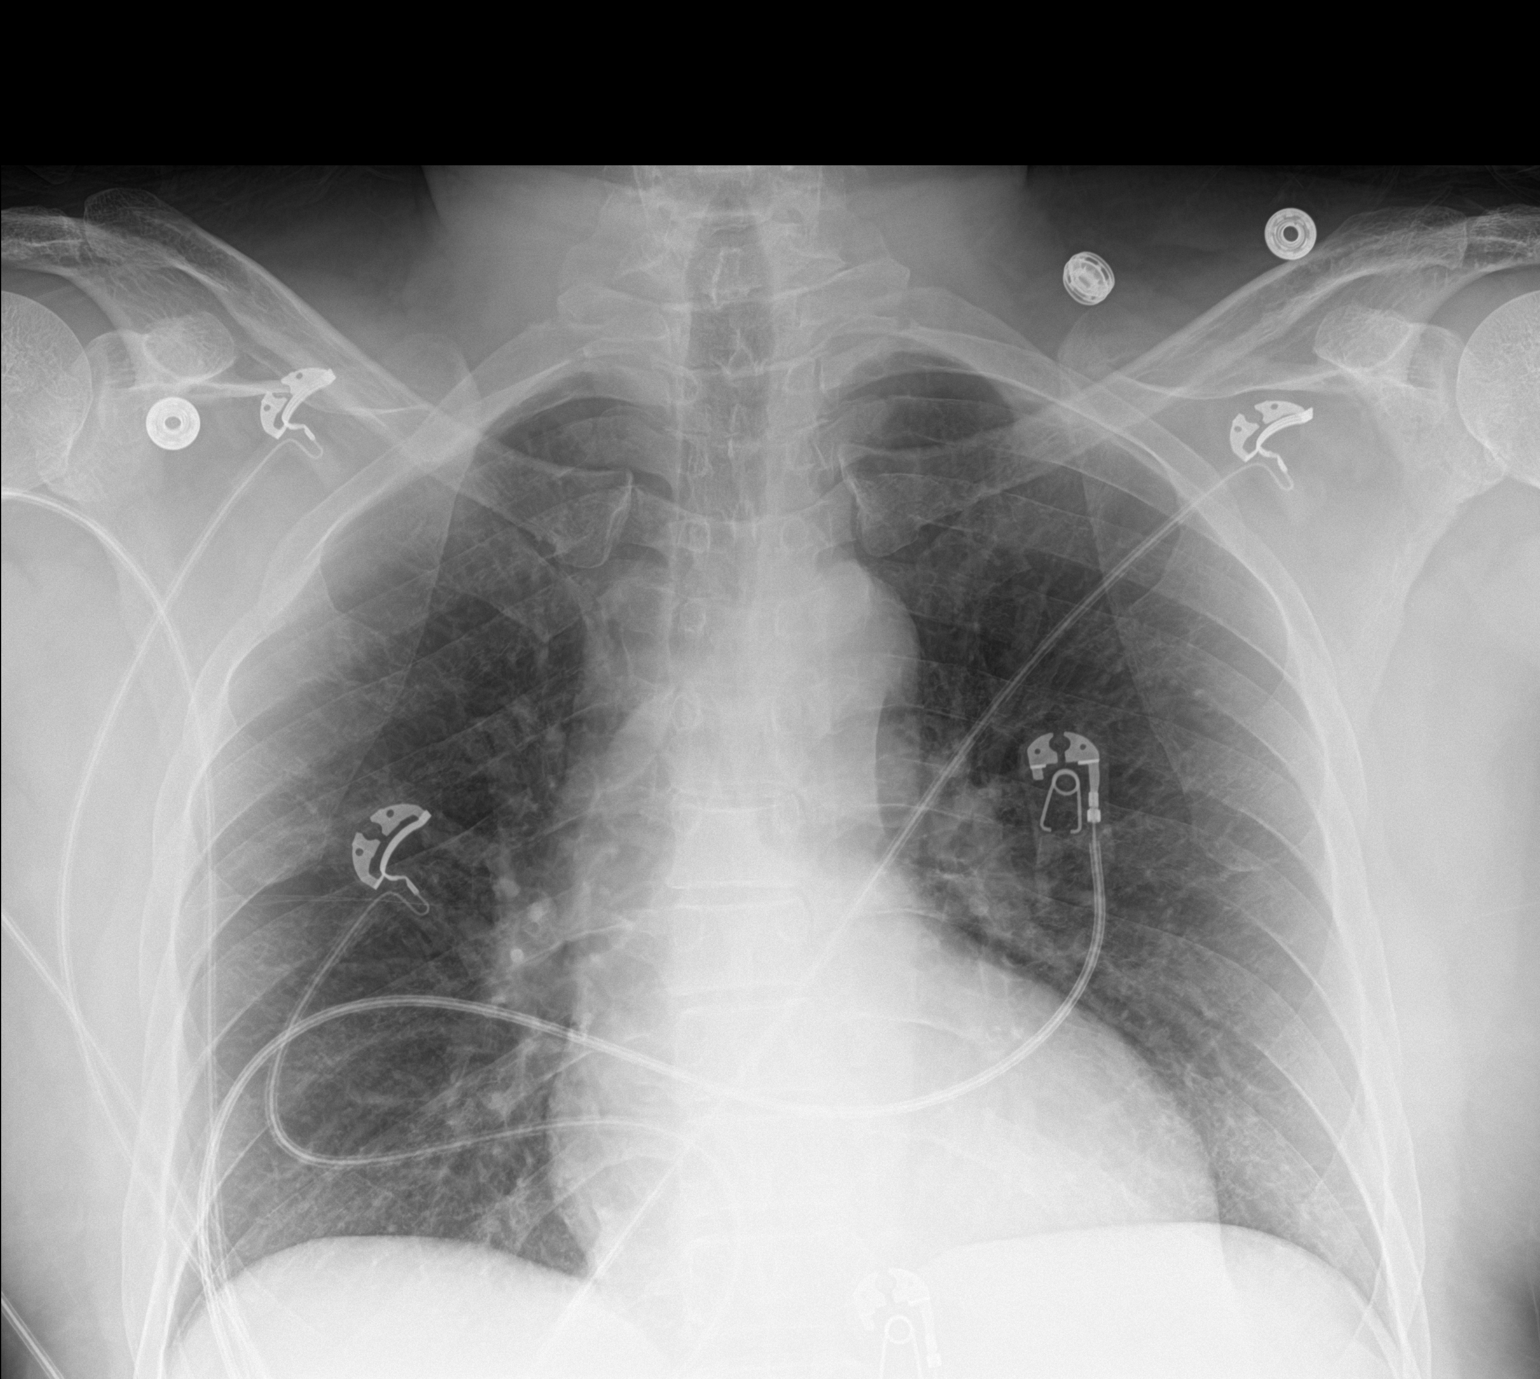

[1 of 1 positions shown; findings below may reference images not displayed]

FINDINGS: The heart size and mediastinal contours are within normal limits.
Both lungs are clear. The visualized skeletal structures are
unremarkable.
IMPRESSION: No active disease.
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
## 2020-10-21 IMAGING — DX DG PORTABLE PELVIS
1 series · 1 of 1 positions shown · non-contrast
Comparison: None.

CLINICAL DATA: Fell down stairs. Left hip pain radiating to the
left leg.

EXAM:
PORTABLE PELVIS 1-2 VIEWS

[pelvis]
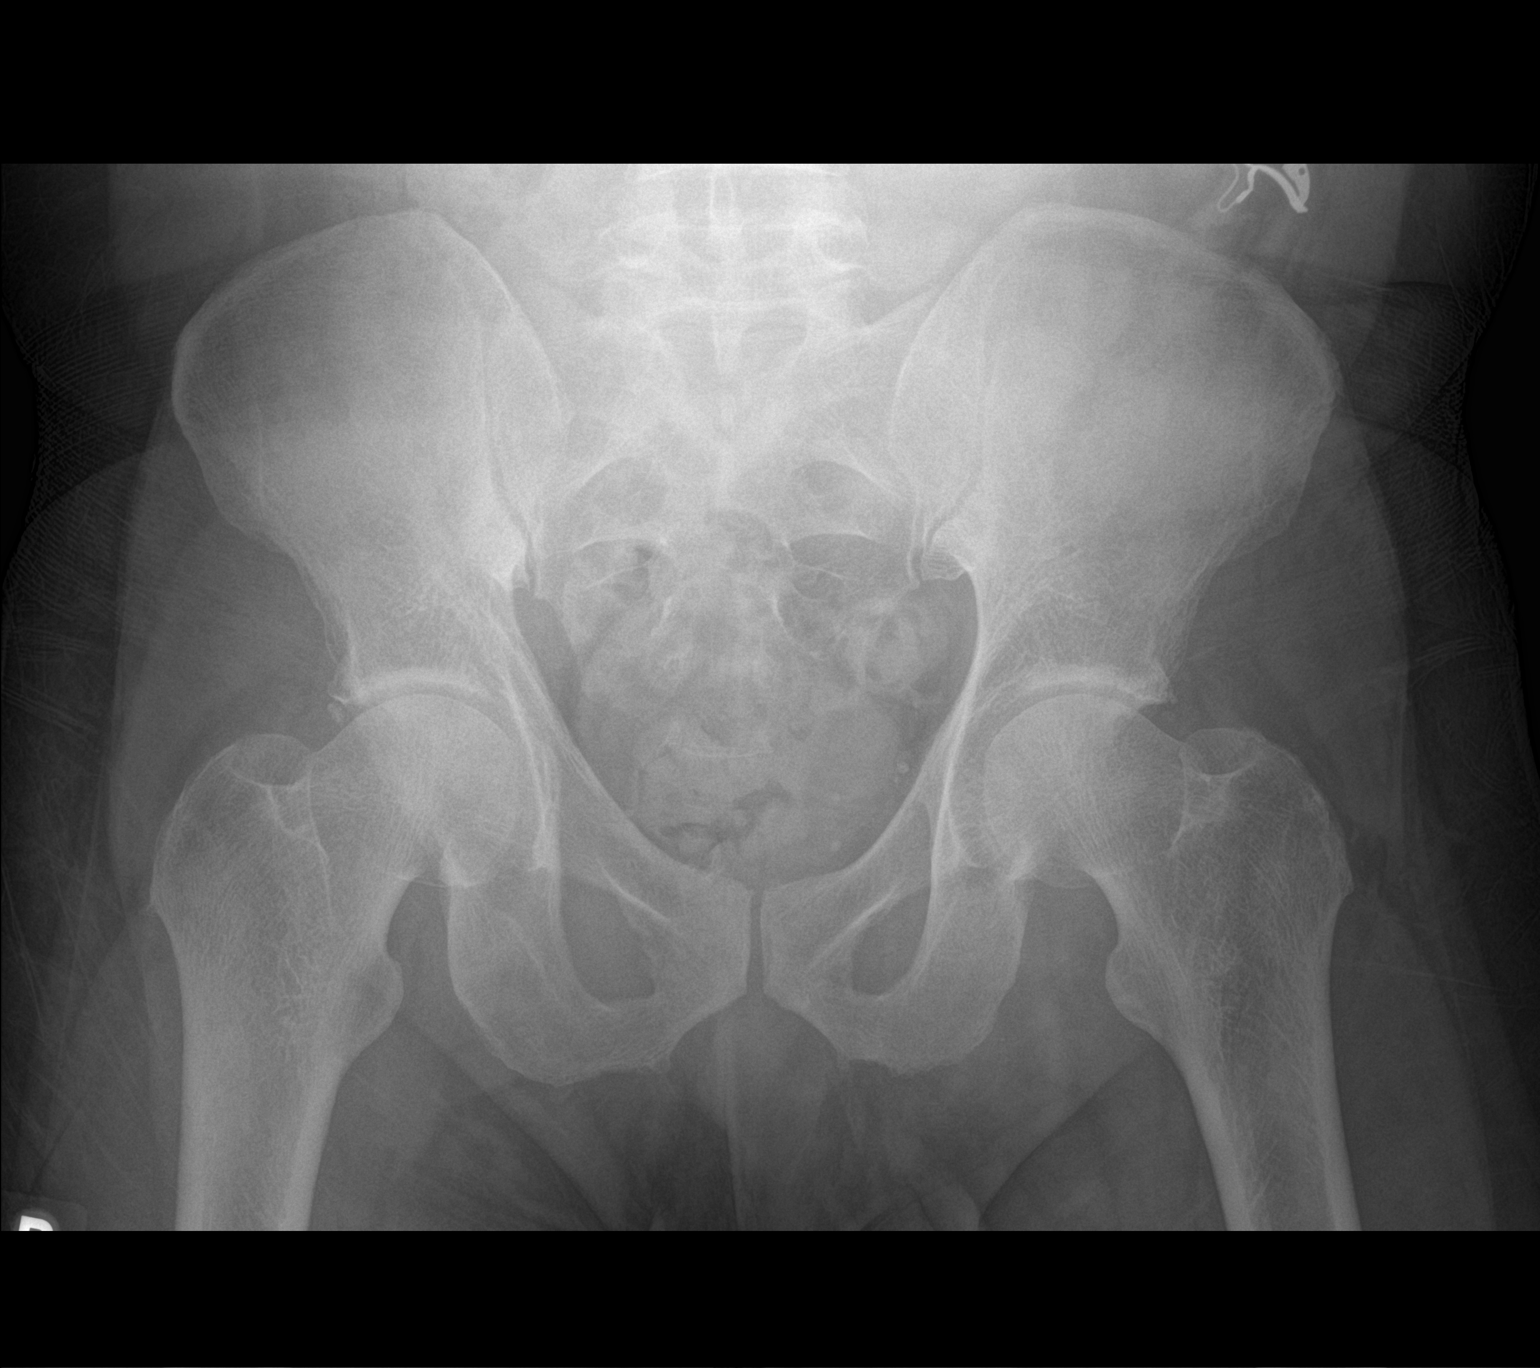

[1 of 1 positions shown; findings below may reference images not displayed]

FINDINGS: There is no evidence of pelvic fracture or diastasis. No pelvic bone
lesions are seen. Mild osteoarthritis of both hip joints.
IMPRESSION: No acute or traumatic finding. Mild osteoarthritis of both hip
joints.

## 2020-10-21 MED ORDER — KETOROLAC TROMETHAMINE 30 MG/ML IJ SOLN
30.0000 mg | Freq: Once | INTRAMUSCULAR | Status: AC
  Administered 2020-10-21: 30 mg via INTRAVENOUS
  Filled 2020-10-21: qty 1

## 2020-10-21 MED ORDER — NAPROXEN 500 MG PO TABS: 500.0000 mg | ORAL_TABLET | Freq: Two times a day (BID) | ORAL | 0 refills | Status: DC

## 2020-10-21 MED ORDER — ONDANSETRON HCL 4 MG/2ML IJ SOLN
4.0000 mg | Freq: Once | INTRAMUSCULAR | Status: AC
  Administered 2020-10-21: 4 mg via INTRAVENOUS
  Filled 2020-10-21: qty 2

## 2020-10-21 MED ORDER — IOHEXOL 300 MG/ML  SOLN
100.0000 mL | Freq: Once | INTRAMUSCULAR | Status: AC | PRN
  Administered 2020-10-21: 100 mL via INTRAVENOUS

## 2020-10-21 MED ORDER — FENTANYL CITRATE (PF) 100 MCG/2ML IJ SOLN
50.0000 ug | Freq: Once | INTRAMUSCULAR | Status: AC
  Administered 2020-10-21: 50 ug via INTRAVENOUS
  Filled 2020-10-21: qty 2

## 2020-10-21 MED ORDER — HYDROCODONE-ACETAMINOPHEN 5-325 MG PO TABS: 1.0000 | ORAL_TABLET | Freq: Four times a day (QID) | ORAL | 0 refills | Status: DC | PRN

## 2020-10-21 MED ORDER — SODIUM CHLORIDE 0.9 % IV BOLUS
1000.0000 mL | Freq: Once | INTRAVENOUS | Status: AC
  Administered 2020-10-21: 1000 mL via INTRAVENOUS

## 2020-10-21 NOTE — ED Provider Notes (Signed)
[place] [facility] EMERGENCY DEPARTMENT Provider Note   CSN: [id number] Arrival date & time: 10/21/20  1313     History Chief Complaint  Patient presents with  . Fall    mechanical fall "down 10 stairs", landed on middle back, pain shooting into left knee, no hx back pain    Stephen Mejia is a 54 y.o. male.  The history is provided by the patient and medical records. No language interpreter was used.  Fall This is a new problem. The current episode started less than 1 hour ago. The problem occurs constantly. The problem has not changed since onset.Pertinent negatives include no chest pain, no abdominal pain, no headaches and no shortness of breath. Nothing aggravates the symptoms. Nothing relieves the symptoms. He has tried nothing for the symptoms. The treatment provided no relief.       Past Medical History:  Diagnosis Date  . Migraine     Patient Active Problem List   Diagnosis Date Noted  . Plantar fasciitis of left foot 04/05/2018    No past surgical history on file.     No family history on file.  Social History   Tobacco Use  . Smoking status: Never Smoker  . Smokeless tobacco: Never Used  Substance Use Topics  . Alcohol use: Never  . Drug use: Never    Home Medications Prior to Admission medications   Medication Sig Start Date End Date Taking? Authorizing Provider  benzonatate (TESSALON) 100 MG capsule Take 1 capsule (100 mg total) by mouth 3 (three) times daily as needed. 12/21/18   Coral Spikes, DO  clotrimazole-betamethasone (LOTRISONE) cream  03/13/18   [provider]  lidocaine (XYLOCAINE) 2 % solution Gargle 15 mL every 3 hours as needed. May swallow if desired. 12/21/18   Coral Spikes, DO  meclizine (ANTIVERT) 25 MG tablet  03/28/18   [provider]    Allergies    Tramadol  Review of Systems   Review of Systems  Constitutional: Negative for chills, diaphoresis, fatigue and fever.  HENT: Negative for  congestion.   Respiratory: Negative for chest tightness, shortness of breath and wheezing.   Cardiovascular: Negative for chest pain, palpitations and leg swelling.  Gastrointestinal: Negative for abdominal pain, constipation, diarrhea, nausea and vomiting.  Genitourinary: Negative for dysuria, flank pain and frequency.  Musculoskeletal: Positive for back pain. Negative for neck pain and neck stiffness.  Skin: Negative for rash and wound.  Neurological: Negative for dizziness, facial asymmetry, weakness, numbness and headaches.  Psychiatric/Behavioral: Negative for agitation and confusion.  All other systems reviewed and are negative.   Physical Exam Updated Vital Signs BP 131/72 (BP Location: Left Arm)   Pulse 79   Temp 98 F (36.7 C) (Oral)   SpO2 100%   Physical Exam Vitals and nursing note reviewed.  Constitutional:      General: He is not in acute distress.    Appearance: He is well-developed. He is not ill-appearing, toxic-appearing or diaphoretic.  HENT:     Head: Normocephalic and atraumatic.     Mouth/Throat:     Mouth: Mucous membranes are moist.     Pharynx: No oropharyngeal exudate or posterior oropharyngeal erythema.  Eyes:     Conjunctiva/sclera: Conjunctivae normal.     Pupils: Pupils are equal, round, and reactive to light.  Cardiovascular:     Rate and Rhythm: Normal rate and regular rhythm.     Pulses: Normal pulses.     Heart sounds: No murmur heard.  Pulmonary:     Effort: Pulmonary effort is normal. No respiratory distress.     Breath sounds: Normal breath sounds. No wheezing, rhonchi or rales.  Chest:     Chest wall: No tenderness.  Abdominal:     General: Abdomen is flat.     Palpations: Abdomen is soft.     Tenderness: There is abdominal tenderness in the left lower quadrant. There is no right CVA tenderness, left CVA tenderness, guarding or rebound.    Musculoskeletal:        General: Tenderness and signs of injury present.     Cervical  back: Neck supple.     Thoracic back: Spasms and tenderness present.     Lumbar back: Signs of trauma, spasms and tenderness present.       Back:     Right lower leg: No edema.     Left lower leg: No edema.  Skin:    General: Skin is warm and dry.     Capillary Refill: Capillary refill takes less than 2 seconds.     Findings: No erythema.  Neurological:     General: No focal deficit present.     Mental Status: He is alert.  Psychiatric:        Mood and Affect: Mood normal.     ED Results / Procedures / Treatments   Labs (all labs ordered are listed, but only abnormal results are displayed) Labs Reviewed  RESPIRATORY PANEL BY RT PCR (FLU A&B, COVID)  CBC WITH DIFFERENTIAL/PLATELET  COMPREHENSIVE METABOLIC PANEL  LIPASE, BLOOD  LACTIC ACID, PLASMA  URINALYSIS, ROUTINE W REFLEX MICROSCOPIC  LACTIC ACID, PLASMA  TYPE AND SCREEN  ABO/RH    EKG EKG Interpretation  Date/Time:  Thursday October 21 2020 13:21:45 EDT Ventricular Rate:  80 PR Interval:    QRS Duration: 86 QT Interval:  356 QTC Calculation: 411 R Axis:   80 Text Interpretation: Sinus rhythm Prominent P waves, nondiagnostic When compared to prior, no significant cahnges seen. No STEMI Confirmed by Antony Blackbird [phone]) on 10/21/2020 1:28:44 PM   Radiology DG Pelvis Portable  Result Date: 10/21/2020 CLINICAL DATA:  [place] down stairs. Left hip pain radiating to the left leg. EXAM: PORTABLE PELVIS 1-2 VIEWS COMPARISON:  None. FINDINGS: There is no evidence of pelvic fracture or diastasis. No pelvic bone lesions are seen. Mild osteoarthritis of both hip joints. IMPRESSION: No acute or traumatic finding. Mild osteoarthritis of both hip joints. Electronically Signed   By: Nelson Chimes M.D.   On: 10/21/2020 14:39   DG Chest Portable 1 View  Result Date: 10/21/2020 CLINICAL DATA:  [place] down stairs. EXAM: PORTABLE CHEST 1 VIEW COMPARISON:  None. FINDINGS: The heart size and mediastinal contours are within normal  limits. Both lungs are clear. The visualized skeletal structures are unremarkable. IMPRESSION: No active disease. Electronically Signed   By: Nelson Chimes M.D.   On: 10/21/2020 14:38    Procedures Procedures (including critical care time)  Medications Ordered in ED Medications  fentaNYL (SUBLIMAZE) injection 50 mcg (50 mcg Intravenous Given 10/21/20 1416)  ondansetron (ZOFRAN) injection 4 mg (4 mg Intravenous Given 10/21/20 1412)  sodium chloride 0.9 % bolus 1,000 mL (1,000 mLs Intravenous New Bag/Given 10/21/20 1413)    ED Course  I have reviewed the triage vital signs and the nursing notes.  Pertinent labs & imaging results that were available during my care of the patient were reviewed by me and considered in my medical decision making (see chart for details).  Clinical Course as of Oct 21 1604  Thu Oct 21, 2020  1555 UA and Covid negative.    [CS]    Clinical Course User Index [CS] Truddie Hidden, MD   MDM Rules/Calculators/A&P                          Stephen Mejia is a 54 y.o. male with a past medical history significant for migraine headaches who presents with a fall downstairs.  Patient reports that he was carrying down books in a laptop today downstairs when he tripped and fell landing on his low back and tumbling down the stairs.  He reports severe pain in his mid back, low back, and left pelvis area.  He reports his left lower extremity hurts too much to try and raise although he denies any numbness or weakness in the foot.  He denies any lacerations.  He reports he did not hit his head or have any neck pain.  He reports some nausea due to the pain but denies any vision changes or speech difficulties peer denies any chest pain or shortness of breath, denies any arm injuries.  He denies a history of back surgeries or back injuries in the past.  On exam, lungs are clear and chest is nontender.  Mid back is tender to palpation when rolled as is his low back.  Tenderness  in his left pelvis and hip area.  No femoral tenderness.  No tenderness in the knee or ankle.  Normal pulses, strength, and sensation in the feet.  Abdomen had normal bowel sounds.  Anterior abdomen was nontender but left lower quadrant was somewhat tender.  No focal neurologic deficits initially.  Pupils are symmetric reactive normal extract movements.  Due to his fall, will get a portable chest and pelvis x-ray.  My bedside review did not show any acute pelvis fracture however, given the significant pain, will get CT imaging of the chest/abdomen/pelvis.  Care transferred oncoming team awaiting for results of diagnostic work-up.  Anticipate discharge with soft tissue injuries if imaging is all reassuring and patient is able to safely ambulate.   Final Clinical Impression(s) / ED Diagnoses Final diagnoses:  Fall down stairs, initial encounter     Clinical Impression: 1. Fall down stairs, initial encounter     Disposition: Care transferred oncoming team awaiting for results of diagnostic work-up.  Anticipate discharge with soft tissue injuries if imaging is all reassuring and patient is able to safely ambulate.  This note was prepared with assistance of Systems analyst. Occasional wrong-word or sound-a-like substitutions may have occurred due to the inherent limitations of voice recognition software.     Amyjo Mizrachi, Gwenyth Allegra, MD 10/21/20 [phone]

## 2020-10-21 NOTE — ED Triage Notes (Signed)
Pt arrived via medic after mechanical fall down approx 10 stairs. Hx of falling "on same step". Denies LOC, c/o "mid back pain into left knee". Pt c/o "lightheadedness after the fall d/t increased pain.

## 2020-10-21 NOTE — ED Provider Notes (Signed)
Care of the patient assumed at the change of shift. Patient with mechanical fall, complaining of low back and hip pain. Plain films neg, CT imaging pending.  Physical Exam  BP 131/72 (BP Location: Left Arm)   Pulse 79   Temp 98 F (36.7 C) (Oral)   Ht 5\' 8"  (1.727 m)   Wt 81.6 kg   SpO2 100%   BMI 27.37 kg/m   Physical Exam  ED Course/Procedures   Clinical Course as of Oct 21 2126  Thu Oct 21, 2020  1555 UA and Covid negative.    [CS]  1925 Per CT tech, there are technical issues with getting the CT images to Radiology for interpretation. Patient had refused IV contrast so order was switched to non-contrast. I have reviewed the images and do not see an obvious skeletal fracture. Toradol ordered for pain control pending CT read.    [CS]  2029 Technical issues continue with this patient's CT study. I have spoke with CT tech and [facility] and they are asking for me to re-order the scans again to help solve the problem.    [CS]  2124 CT has resulted, negative for acute injury. Patient reports pain is improving and he is able to move his leg now. Plan discharge home, Rx for Naprosyn, Norco and rest at home. PCP follow up if not improved by next week.    [CS]    Clinical Course User Index [CS] Truddie Hidden, MD    Procedures  MDM       Truddie Hidden, MD 10/21/20 2127

## 2021-01-06 ENCOUNTER — Other Ambulatory Visit: Payer: Self-pay | Admitting: Internal Medicine

## 2021-01-09 LAB — SARS-COV-2 RNA,(COVID-19) QUALITATIVE NAAT: SARS CoV2 RNA: DETECTED — AB

## 2021-02-03 ENCOUNTER — Other Ambulatory Visit: Payer: Self-pay | Admitting: Internal Medicine

## 2021-02-04 LAB — VITAMIN D 25 HYDROXY (VIT D DEFICIENCY, FRACTURES): Vit D, 25-Hydroxy: 40 ng/mL (ref 30–100)

## 2021-02-04 LAB — CBC
HCT: 41 % (ref 38.5–50.0)
Hemoglobin: 13.9 g/dL (ref 13.2–17.1)
MCH: 31.4 pg (ref 27.0–33.0)
MCHC: 33.9 g/dL (ref 32.0–36.0)
MCV: 92.6 fL (ref 80.0–100.0)
MPV: 11 fL (ref 7.5–12.5)
Platelets: 254 10*3/uL (ref 140–400)
RBC: 4.43 10*6/uL (ref 4.20–5.80)
RDW: 12.1 % (ref 11.0–15.0)
WBC: 5.5 10*3/uL (ref 3.8–10.8)

## 2021-02-04 LAB — COMPLETE METABOLIC PANEL WITH GFR
AG Ratio: 1.4 (calc) (ref 1.0–2.5)
ALT: 24 U/L (ref 9–46)
AST: 22 U/L (ref 10–35)
Albumin: 4 g/dL (ref 3.6–5.1)
Alkaline phosphatase (APISO): 65 U/L (ref 35–144)
BUN: 15 mg/dL (ref 7–25)
CO2: 26 mmol/L (ref 20–32)
Calcium: 9.5 mg/dL (ref 8.6–10.3)
Chloride: 103 mmol/L (ref 98–110)
Creat: 0.93 mg/dL (ref 0.70–1.33)
GFR, Est African American: 106 mL/min/{1.73_m2} (ref >=60)
GFR, Est Non African American: 91 mL/min/{1.73_m2} (ref >=60)
Globulin: 2.9 g/dL (calc) (ref 1.9–3.7)
Glucose, Bld: 82 mg/dL (ref 65–99)
Potassium: 4.2 mmol/L (ref 3.5–5.3)
Sodium: 138 mmol/L (ref 135–146)
Total Bilirubin: 0.5 mg/dL (ref 0.2–1.2)
Total Protein: 6.9 g/dL (ref 6.1–8.1)

## 2021-02-04 LAB — LIPID PANEL
Cholesterol: 211 mg/dL — ABNORMAL HIGH (ref <200)
HDL: 49 mg/dL (ref >=40)
LDL Cholesterol (Calc): 142 mg/dL (calc) — ABNORMAL HIGH
Non-HDL Cholesterol (Calc): 162 mg/dL (calc) — ABNORMAL HIGH (ref <130)
Total CHOL/HDL Ratio: 4.3 (calc) (ref <5.0)
Triglycerides: 91 mg/dL (ref <150)

## 2021-02-04 LAB — TSH: TSH: 1.4 mIU/L (ref 0.40–4.50)

## 2021-02-04 LAB — PSA: PSA: 1.2 ng/mL (ref <=4.0)

## 2021-06-22 ENCOUNTER — Other Ambulatory Visit: Payer: Self-pay | Admitting: Internal Medicine

## 2021-06-22 DIAGNOSIS — R42 Dizziness and giddiness: Secondary | ICD-10-CM

## 2021-09-30 ENCOUNTER — Emergency Department: Payer: BC Managed Care – PPO

## 2021-09-30 ENCOUNTER — Emergency Department
Admission: EM | Admit: 2021-09-30 | Discharge: 2021-09-30 | Disposition: A | Discharge status: Home / Self Care | Payer: BC Managed Care – PPO | Attending: Emergency Medicine | Admitting: Emergency Medicine

## 2021-09-30 ENCOUNTER — Other Ambulatory Visit: Payer: Self-pay | Admitting: Internal Medicine

## 2021-09-30 ENCOUNTER — Other Ambulatory Visit: Payer: Self-pay

## 2021-09-30 DIAGNOSIS — Y92410 Unspecified street and highway as the place of occurrence of the external cause: Secondary | ICD-10-CM | POA: Diagnosis not present

## 2021-09-30 DIAGNOSIS — S3991XA Unspecified injury of abdomen, initial encounter: Secondary | ICD-10-CM | POA: Insufficient documentation

## 2021-09-30 DIAGNOSIS — R072 Precordial pain: Secondary | ICD-10-CM | POA: Insufficient documentation

## 2021-09-30 DIAGNOSIS — G43909 Migraine, unspecified, not intractable, without status migrainosus: Secondary | ICD-10-CM | POA: Diagnosis not present

## 2021-09-30 LAB — URINALYSIS, COMPLETE (UACMP) WITH MICROSCOPIC
Bacteria, UA: NONE SEEN
Bilirubin Urine: NEGATIVE
Glucose, UA: NEGATIVE mg/dL
Hgb urine dipstick: NEGATIVE
Ketones, ur: NEGATIVE mg/dL
Leukocytes,Ua: NEGATIVE
Nitrite: NEGATIVE
Protein, ur: NEGATIVE mg/dL
Specific Gravity, Urine: 1.003 — ABNORMAL LOW (ref 1.005–1.030)
Squamous Epithelial / HPF: NONE SEEN (ref 0–5)
pH: 7 (ref 5.0–8.0)

## 2021-09-30 LAB — BASIC METABOLIC PANEL WITH GFR
BUN: 14 mg/dL (ref 7–25)
CO2: 26 mmol/L (ref 20–32)
Calcium: 9.4 mg/dL (ref 8.6–10.3)
Chloride: 104 mmol/L (ref 98–110)
Creat: 1.01 mg/dL (ref 0.70–1.30)
Glucose, Bld: 82 mg/dL (ref 65–99)
Potassium: 4 mmol/L (ref 3.5–5.3)
Sodium: 138 mmol/L (ref 135–146)
eGFR: 87 mL/min/{1.73_m2} (ref >=60)

## 2021-09-30 LAB — CBC
HCT: 40.4 % (ref 38.5–50.0)
Hemoglobin: 13.7 g/dL (ref 13.2–17.1)
MCH: 31.6 pg (ref 27.0–33.0)
MCHC: 33.9 g/dL (ref 32.0–36.0)
MCV: 93.1 fL (ref 80.0–100.0)
MPV: 11.1 fL (ref 7.5–12.5)
Platelets: 236 10*3/uL (ref 140–400)
RBC: 4.34 10*6/uL (ref 4.20–5.80)
RDW: 12.2 % (ref 11.0–15.0)
WBC: 4.8 10*3/uL (ref 3.8–10.8)

## 2021-09-30 LAB — LIPID PANEL
Cholesterol: 226 mg/dL — ABNORMAL HIGH (ref <200)
HDL: 44 mg/dL (ref >=40)
LDL Cholesterol (Calc): 161 mg/dL (calc) — ABNORMAL HIGH
Non-HDL Cholesterol (Calc): 182 mg/dL (calc) — ABNORMAL HIGH (ref <130)
Total CHOL/HDL Ratio: 5.1 (calc) — ABNORMAL HIGH (ref <5.0)
Triglycerides: 101 mg/dL (ref <150)

## 2021-09-30 LAB — CBC WITH DIFFERENTIAL/PLATELET
Abs Immature Granulocytes: 0.03 10*3/uL (ref 0.00–0.07)
Basophils Absolute: 0 10*3/uL (ref 0.0–0.1)
Basophils Relative: 0 %
Eosinophils Absolute: 0 10*3/uL (ref 0.0–0.5)
Eosinophils Relative: 1 %
HCT: 40.1 % (ref 39.0–52.0)
Hemoglobin: 13.7 g/dL (ref 13.0–17.0)
Immature Granulocytes: 0 %
Lymphocytes Relative: 20 %
Lymphs Abs: 1.6 10*3/uL (ref 0.7–4.0)
MCH: 31.9 pg (ref 26.0–34.0)
MCHC: 34.2 g/dL (ref 30.0–36.0)
MCV: 93.5 fL (ref 80.0–100.0)
Monocytes Absolute: 0.5 10*3/uL (ref 0.1–1.0)
Monocytes Relative: 7 %
Neutro Abs: 5.4 10*3/uL (ref 1.7–7.7)
Neutrophils Relative %: 72 %
Platelets: 230 10*3/uL (ref 150–400)
RBC: 4.29 MIL/uL (ref 4.22–5.81)
RDW: 12.5 % (ref 11.5–15.5)
WBC: 7.6 10*3/uL (ref 4.0–10.5)
nRBC: 0 % (ref 0.0–0.2)

## 2021-09-30 LAB — COMPREHENSIVE METABOLIC PANEL
ALT: 29 U/L (ref 0–44)
AST: 26 U/L (ref 15–41)
Albumin: 4 g/dL (ref 3.5–5.0)
Alkaline Phosphatase: 67 U/L (ref 38–126)
Anion gap: 8 (ref 5–15)
BUN: 15 mg/dL (ref 6–20)
CO2: 28 mmol/L (ref 22–32)
Calcium: 9.4 mg/dL (ref 8.9–10.3)
Chloride: 103 mmol/L (ref 98–111)
Creatinine, Ser: 1.03 mg/dL (ref 0.61–1.24)
GFR, Estimated: >60 mL/min (ref >60)
Glucose, Bld: 105 mg/dL — ABNORMAL HIGH (ref 70–99)
Potassium: 3.9 mmol/L (ref 3.5–5.1)
Sodium: 139 mmol/L (ref 135–145)
Total Bilirubin: 0.6 mg/dL (ref 0.3–1.2)
Total Protein: 7.6 g/dL (ref 6.5–8.1)

## 2021-09-30 LAB — LIPASE, BLOOD: Lipase: 26 U/L (ref 11–51)
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
## 2021-09-30 MED ORDER — LACTATED RINGERS IV BOLUS
1000.0000 mL | Freq: Once | INTRAVENOUS | Status: AC
  Administered 2021-09-30: 1000 mL via INTRAVENOUS

## 2021-09-30 MED ORDER — IOHEXOL 350 MG/ML SOLN
100.0000 mL | Freq: Once | INTRAVENOUS | Status: AC | PRN
  Administered 2021-09-30: 100 mL via INTRAVENOUS

## 2021-09-30 MED ORDER — OXYCODONE-ACETAMINOPHEN 5-325 MG PO TABS
1.0000 | ORAL_TABLET | Freq: Once | ORAL | Status: AC
Start: 2021-09-30 — End: 2021-09-30
  Administered 2021-09-30: 1 via ORAL
  Filled 2021-09-30: qty 1

## 2021-09-30 MED ORDER — OXYCODONE-ACETAMINOPHEN 5-325 MG PO TABS
1.0000 | ORAL_TABLET | Freq: Four times a day (QID) | ORAL | 0 refills | Status: DC | PRN
Start: 2021-09-30 — End: 2022-04-16

## 2021-09-30 MED ORDER — KETOROLAC TROMETHAMINE 30 MG/ML IJ SOLN
15.0000 mg | INTRAMUSCULAR | Status: AC
  Administered 2021-09-30: 15 mg via INTRAVENOUS
  Filled 2021-09-30: qty 1

## 2021-09-30 MED ORDER — NAPROXEN 500 MG PO TABS: 500.0000 mg | ORAL_TABLET | Freq: Two times a day (BID) | ORAL | 0 refills | Status: DC

## 2021-09-30 NOTE — Discharge Instructions (Addendum)
Your lab tests, CT scans and x-rays were all okay today.  You are likely to have a lot of muscle pain and soreness over the next few days, but this should improve with anti-inflammatory medicine and gentle activity and light stretching.

## 2021-09-30 NOTE — ED Notes (Signed)
Pt to radiology for ct at this time
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
## 2021-12-08 ENCOUNTER — Other Ambulatory Visit: Payer: Self-pay | Admitting: Internal Medicine

## 2021-12-08 DIAGNOSIS — M5416 Radiculopathy, lumbar region: Secondary | ICD-10-CM

## 2022-01-02 ENCOUNTER — Ambulatory Visit
Admission: RE | Admit: 2022-01-02 | Discharge: 2022-01-02 | Disposition: A | Discharge status: Home / Self Care | Payer: BC Managed Care – PPO | Source: Ambulatory Visit | Attending: Internal Medicine | Admitting: Internal Medicine

## 2022-01-02 DIAGNOSIS — M5416 Radiculopathy, lumbar region: Secondary | ICD-10-CM

## 2022-01-16 ENCOUNTER — Other Ambulatory Visit: Payer: Self-pay

## 2022-01-16 ENCOUNTER — Ambulatory Visit
Admission: RE | Admit: 2022-01-16 | Discharge: 2022-01-16 | Disposition: A | Discharge status: Home / Self Care | Payer: BC Managed Care – PPO | Source: Ambulatory Visit | Attending: Internal Medicine | Admitting: Internal Medicine

## 2022-01-16 IMAGING — MR MR LUMBAR SPINE WO/W CM
4 of 7 series · 25 of 48 positions shown · IV contrast (15 ml multihance)
Comparison: No prior MRI, correlation is made with lumbar spine
radiographs and CT chest abdomen pelvis [DATE].

CLINICAL DATA: Low back pain radiating to left side, down left leg,
with left leg weakness and numbness, no prior surgery

EXAM:
MRI LUMBAR SPINE WITHOUT AND WITH CONTRAST
TECHNIQUE: Multiplanar and multiecho pulse sequences of the lumbar spine were
obtained without and with intravenous contrast.
CONTRAST:  15mL MULTIHANCE GADOBENATE DIMEGLUMINE 529 MG/ML IV SOLN

[Series 5: T1 · sagittal · 4.0mm · 0.59mm/px · 5 of 17 slices shown (1 of 2)]
[im 1/17]
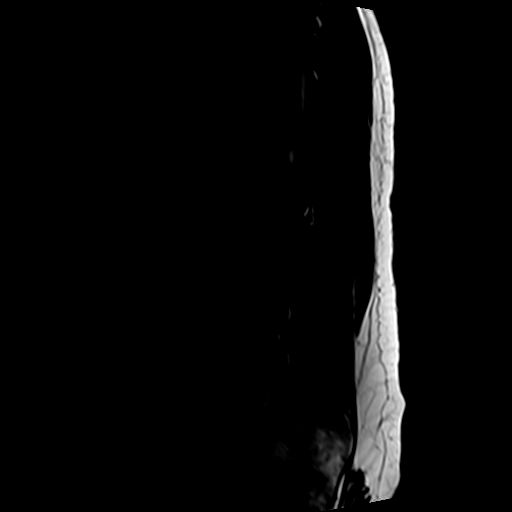
[im 5/17]
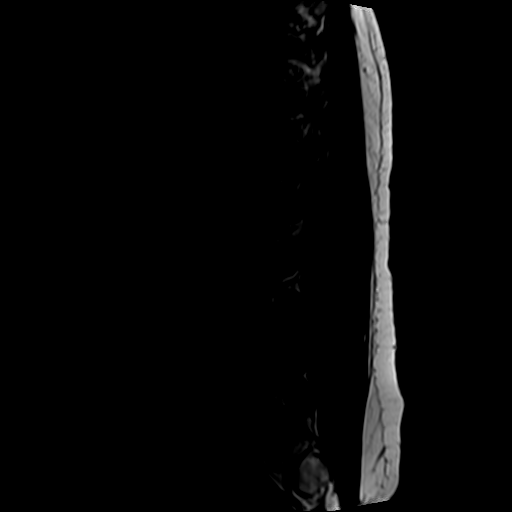
[im 9/17]
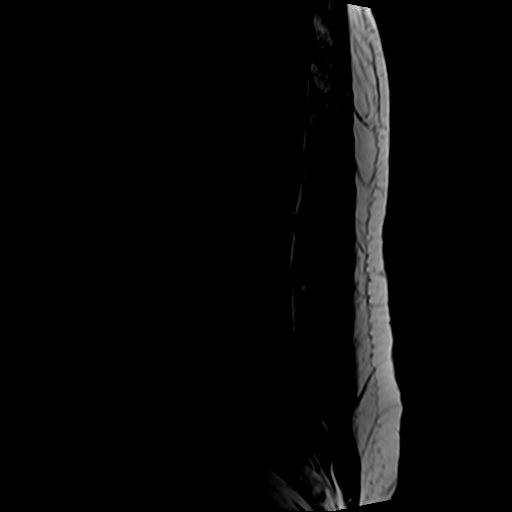
[im 13/17]
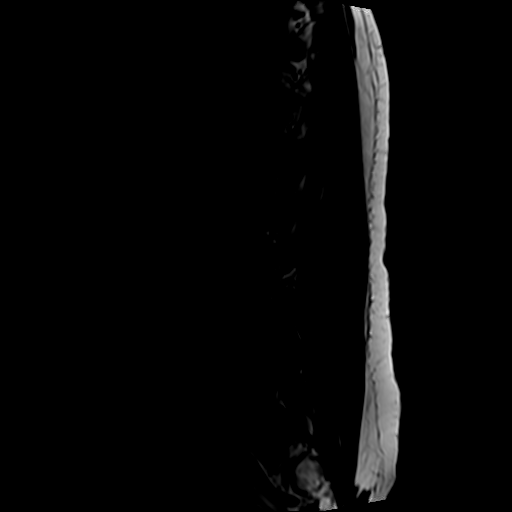
[im 17/17]
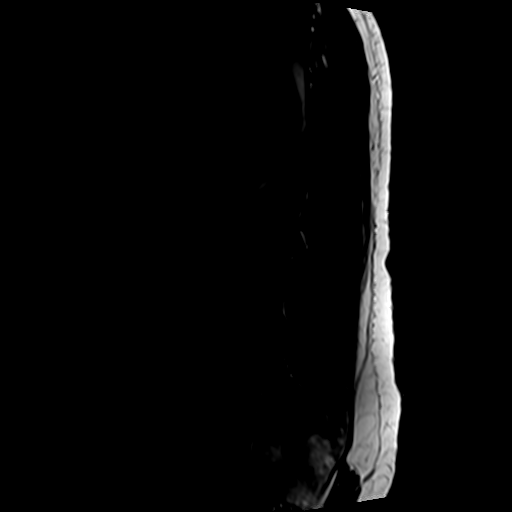

[Series 6: T2 · axial · 4.0mm · 0.78mm/px · z∈[-110,+104]mm · 8 of 41 slices shown (1 of 2)]
[im 1/41]
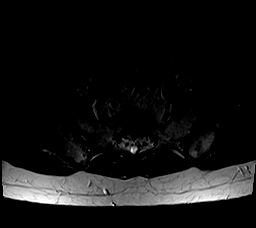
[im 5/41]
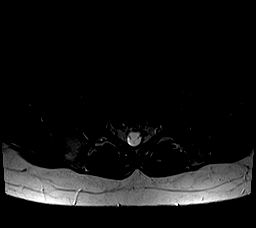
[im 14/41]
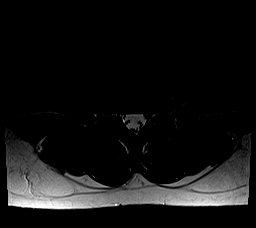
[im 18/41]
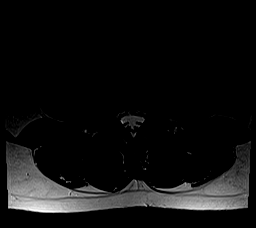
[im 23/41]
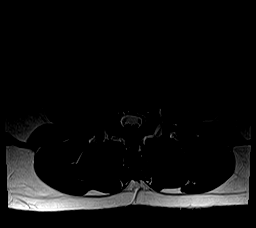
[im 27/41]
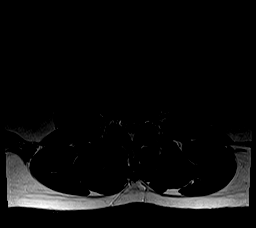
[im 36/41]
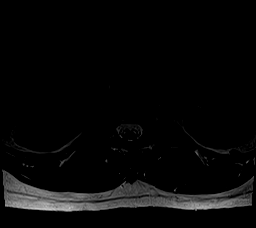
[im 41/41]
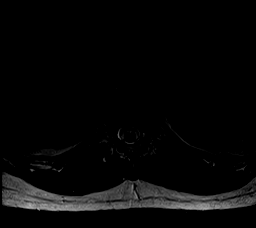

[Series 7: T1 · axial · 4.0mm · 0.39mm/px · z∈[-110,+104]mm · 8 of 41 slices shown (2 of 2)]
[im 1/41]
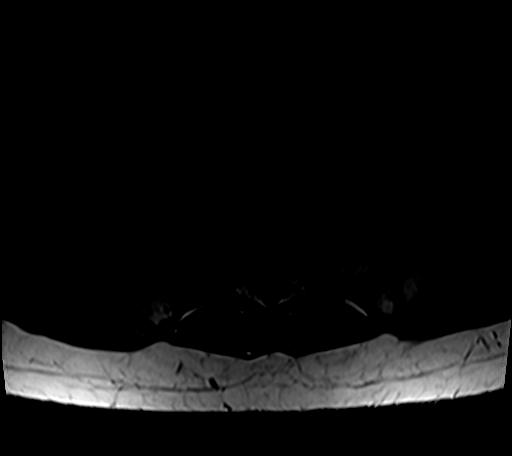
[im 5/41]
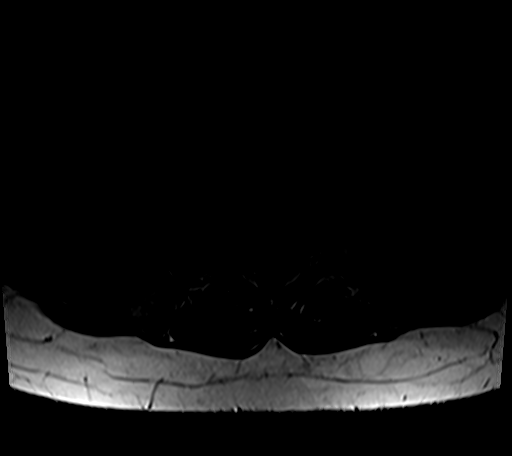
[im 14/41]
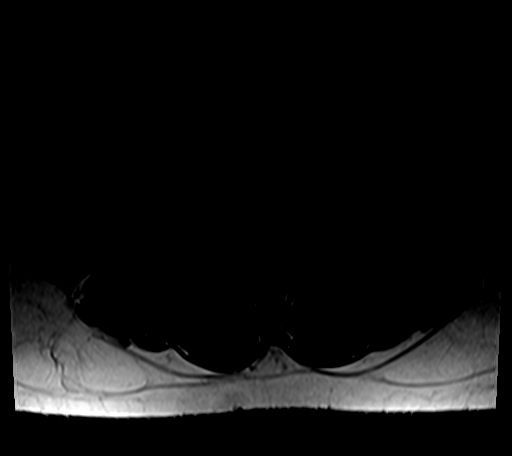
[im 18/41]
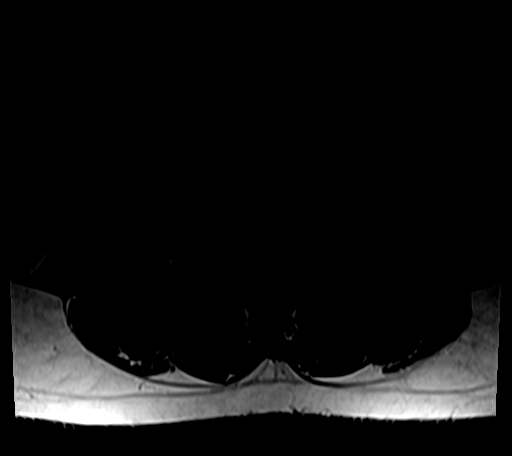
[im 23/41]
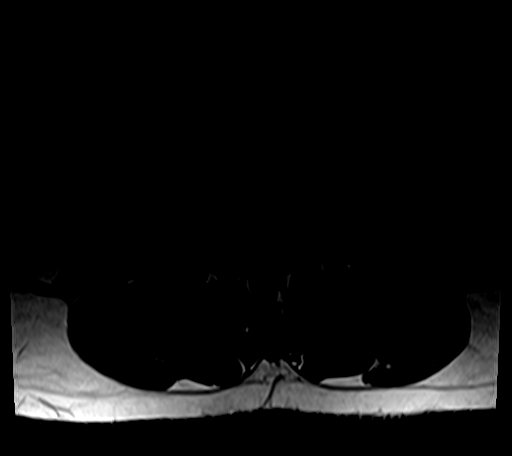
[im 27/41]
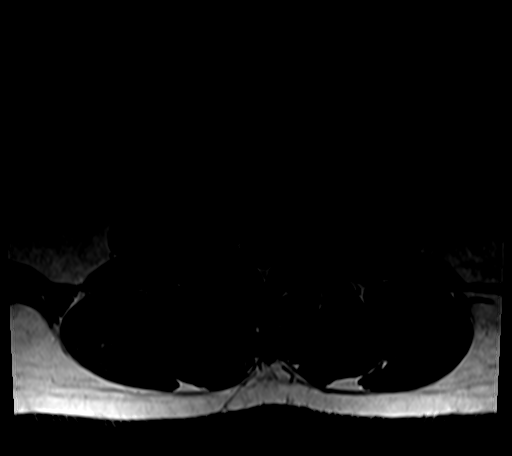
[im 36/41]
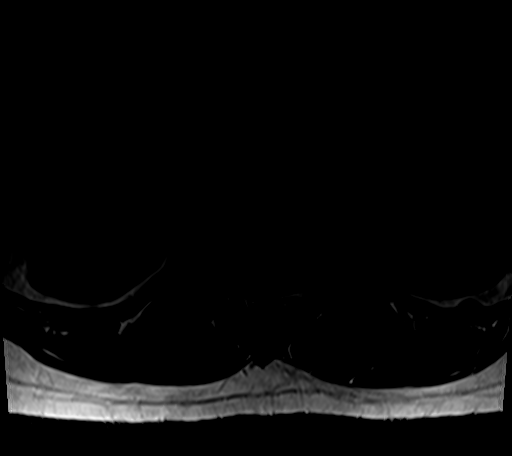
[im 41/41]
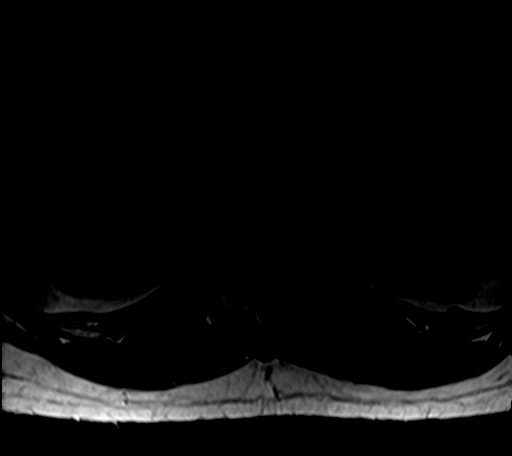

[Series 10: T2 · sagittal · 4.0mm · 0.59mm/px · 4 of 17 slices shown (2 of 2)]
[im 1/17]
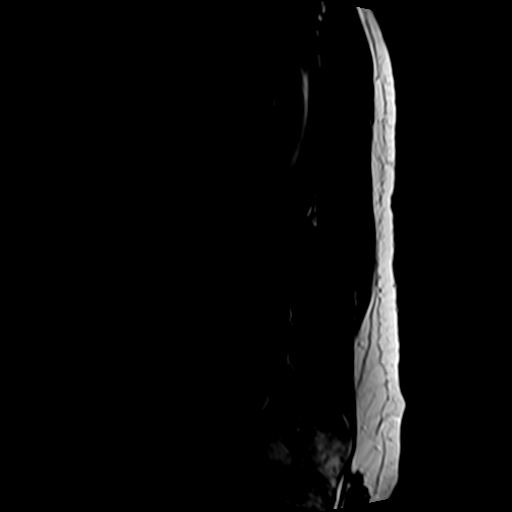
[im 6/17]
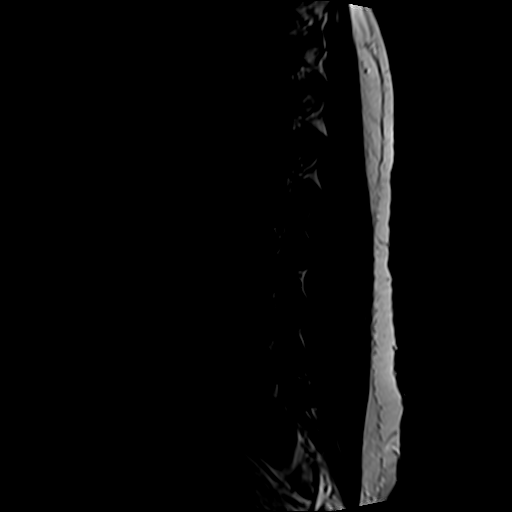
[im 11/17]
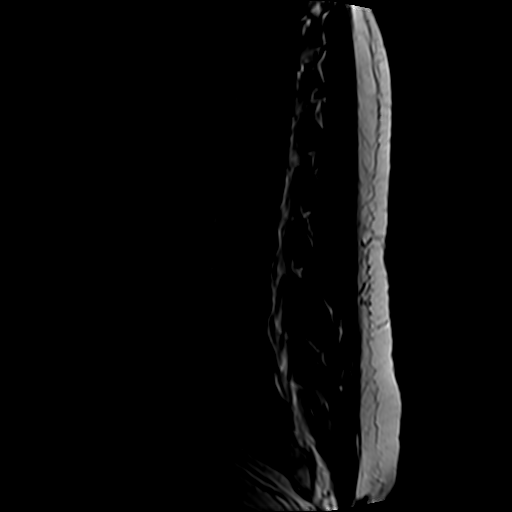
[im 17/17]
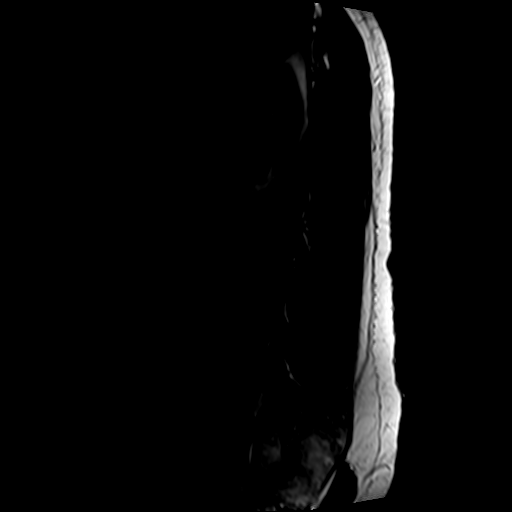

[25 of 48 positions shown; findings below may reference images not displayed]

FINDINGS: Segmentation:  Standard.

Alignment:  Physiologic.

Vertebrae: No acute fracture or suspicious osseous lesion. No
abnormal enhancement.

Conus medullaris and cauda equina: Conus extends to the L2 level.
Conus and cauda equina appear normal. No abnormal enhancement in the
spinal cord or cauda equina.

Paraspinal and other soft tissues: Negative.

Disc levels:

T12-L1: No significant disc bulge. No spinal canal stenosis or
neural foraminal narrowing.

L1-L2: Mild disc bulge with central protrusion with annular fissure.
No spinal canal stenosis or neural foraminal narrowing.

L2-L3: Moderate disc bulge with superimposed right paracentral disc
protrusion. Mild facet arthropathy. No spinal canal stenosis or
neural foraminal narrowing.

L3-L4: No significant disc bulge. No spinal canal stenosis or neural
foraminal narrowing.

L4-L5: Minimal disc bulge. Mild facet arthropathy. No spinal canal
stenosis or neural foraminal narrowing.

L5-S1: Mild disc bulge with central annular fissure. Mild
left-greater-than-right facet arthropathy. No spinal canal stenosis
or neural foraminal narrowing.
IMPRESSION: Mild degenerative changes, most prominent at L1-L2 and L2-L3,
without significant spinal canal stenosis or neural foraminal
narrowing. No abnormal enhancement.

## 2022-01-16 MED ORDER — GADOBENATE DIMEGLUMINE 529 MG/ML IV SOLN
15.0000 mL | Freq: Once | INTRAVENOUS | Status: AC | PRN
  Administered 2022-01-16: 15 mL via INTRAVENOUS

## 2022-04-16 ENCOUNTER — Emergency Department: Payer: BC Managed Care – PPO

## 2022-04-16 ENCOUNTER — Inpatient Hospital Stay
Admit: 2022-04-16 | Discharge: 2022-04-16 | Disposition: A | Discharge status: Home / Self Care | Payer: BC Managed Care – PPO | Attending: Internal Medicine | Admitting: Internal Medicine

## 2022-04-16 ENCOUNTER — Other Ambulatory Visit: Payer: Self-pay

## 2022-04-16 ENCOUNTER — Inpatient Hospital Stay: Payer: BC Managed Care – PPO

## 2022-04-16 ENCOUNTER — Inpatient Hospital Stay
Admission: EM | Admit: 2022-04-16 | Discharge: 2022-04-17 | DRG: 312 | Disposition: A | Discharge status: Home / Self Care | Payer: BC Managed Care – PPO | Attending: Internal Medicine | Admitting: Internal Medicine

## 2022-04-16 DIAGNOSIS — Z833 Family history of diabetes mellitus: Secondary | ICD-10-CM

## 2022-04-16 DIAGNOSIS — Z28310 Unvaccinated for covid-19: Secondary | ICD-10-CM | POA: Diagnosis not present

## 2022-04-16 DIAGNOSIS — Z885 Allergy status to narcotic agent status: Secondary | ICD-10-CM

## 2022-04-16 DIAGNOSIS — Z8249 Family history of ischemic heart disease and other diseases of the circulatory system: Secondary | ICD-10-CM | POA: Diagnosis not present

## 2022-04-16 DIAGNOSIS — N400 Enlarged prostate without lower urinary tract symptoms: Secondary | ICD-10-CM | POA: Diagnosis present

## 2022-04-16 DIAGNOSIS — Z79899 Other long term (current) drug therapy: Secondary | ICD-10-CM | POA: Diagnosis not present

## 2022-04-16 DIAGNOSIS — R55 Syncope and collapse: Principal | ICD-10-CM | POA: Diagnosis present

## 2022-04-16 DIAGNOSIS — I5189 Other ill-defined heart diseases: Secondary | ICD-10-CM | POA: Diagnosis not present

## 2022-04-16 DIAGNOSIS — U071 COVID-19: Secondary | ICD-10-CM | POA: Diagnosis present

## 2022-04-16 DIAGNOSIS — G43909 Migraine, unspecified, not intractable, without status migrainosus: Secondary | ICD-10-CM | POA: Diagnosis present

## 2022-04-16 LAB — BASIC METABOLIC PANEL
Anion gap: 8 (ref 5–15)
BUN: 14 mg/dL (ref 6–20)
CO2: 24 mmol/L (ref 22–32)
Calcium: 9.2 mg/dL (ref 8.9–10.3)
Chloride: 105 mmol/L (ref 98–111)
Creatinine, Ser: 1.03 mg/dL (ref 0.61–1.24)
GFR, Estimated: >60 mL/min (ref >60)
Glucose, Bld: 107 mg/dL — ABNORMAL HIGH (ref 70–99)
Potassium: 3.6 mmol/L (ref 3.5–5.1)
Sodium: 137 mmol/L (ref 135–145)

## 2022-04-16 LAB — URINALYSIS, ROUTINE W REFLEX MICROSCOPIC
Bilirubin Urine: NEGATIVE
Glucose, UA: NEGATIVE mg/dL
Hgb urine dipstick: NEGATIVE
Ketones, ur: NEGATIVE mg/dL
Leukocytes,Ua: NEGATIVE
Nitrite: NEGATIVE
Protein, ur: NEGATIVE mg/dL
Specific Gravity, Urine: 1.003 — ABNORMAL LOW (ref 1.005–1.030)
pH: 6 (ref 5.0–8.0)

## 2022-04-16 LAB — ECHOCARDIOGRAM COMPLETE
Area-P 1/2: 6.02 cm2
Height: 68 in
S' Lateral: 2.8 cm
Weight: 2896 oz

## 2022-04-16 LAB — CBC
HCT: 38.6 % — ABNORMAL LOW (ref 39.0–52.0)
Hemoglobin: 13.1 g/dL (ref 13.0–17.0)
MCH: 31.1 pg (ref 26.0–34.0)
MCHC: 33.9 g/dL (ref 30.0–36.0)
MCV: 91.7 fL (ref 80.0–100.0)
Platelets: 220 10*3/uL (ref 150–400)
RBC: 4.21 MIL/uL — ABNORMAL LOW (ref 4.22–5.81)
RDW: 12.7 % (ref 11.5–15.5)
WBC: 9.1 10*3/uL (ref 4.0–10.5)
nRBC: 0 % (ref 0.0–0.2)

## 2022-04-16 LAB — T4, FREE: Free T4: 0.71 ng/dL (ref 0.61–1.12)

## 2022-04-16 LAB — RESP PANEL BY RT-PCR (FLU A&B, COVID) ARPGX2
Influenza A by PCR: NEGATIVE
Influenza B by PCR: NEGATIVE
SARS Coronavirus 2 by RT PCR: POSITIVE — AB

## 2022-04-16 LAB — TROPONIN I (HIGH SENSITIVITY)
Troponin I (High Sensitivity): 4 ng/L (ref <18)
Troponin I (High Sensitivity): 4 ng/L (ref <18)

## 2022-04-16 LAB — TSH: TSH: 0.646 u[IU]/mL (ref 0.350–4.500)

## 2022-04-16 LAB — D-DIMER, QUANTITATIVE: D-Dimer, Quant: 1.21 ug/mL-FEU — ABNORMAL HIGH (ref 0.00–0.50)

## 2022-04-16 IMAGING — CT CT HEAD W/O CM
4 series · 17 of 47 positions shown, 19 images · non-contrast
Comparison: [DATE]

CLINICAL DATA: Syncope.



[Series 2: head wo · axial · 0.49mm/px · z∈[-51,+69]mm · 7 of 32 slices shown, 9 images]
[im 4/32  brain]
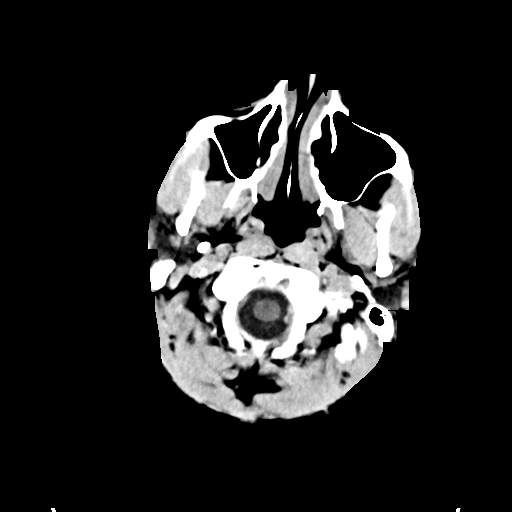
[im 4/32  bone]
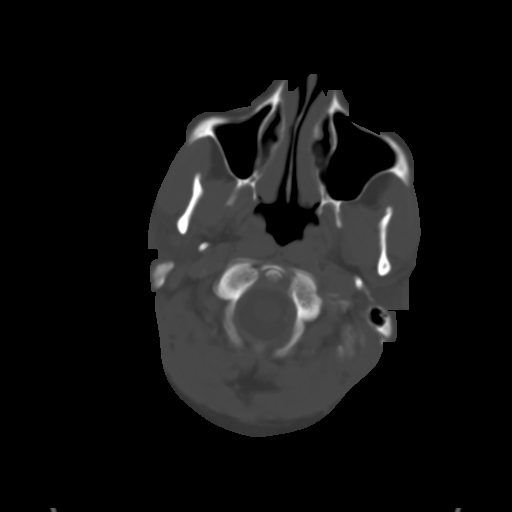
[im 8/32  brain]
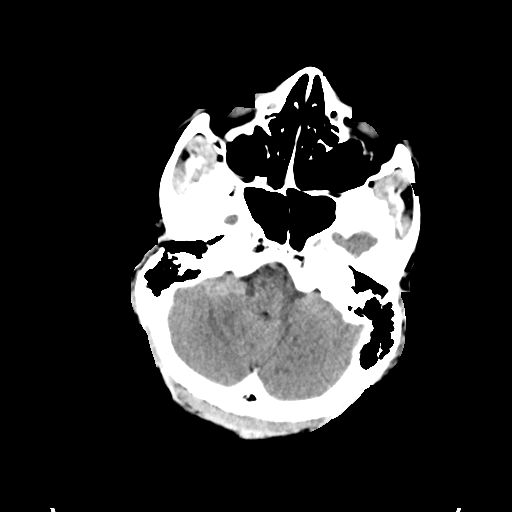
[im 12/32  brain]
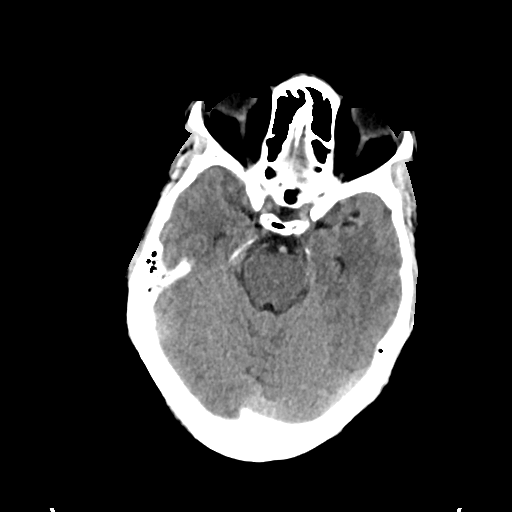
[im 16/32  brain]
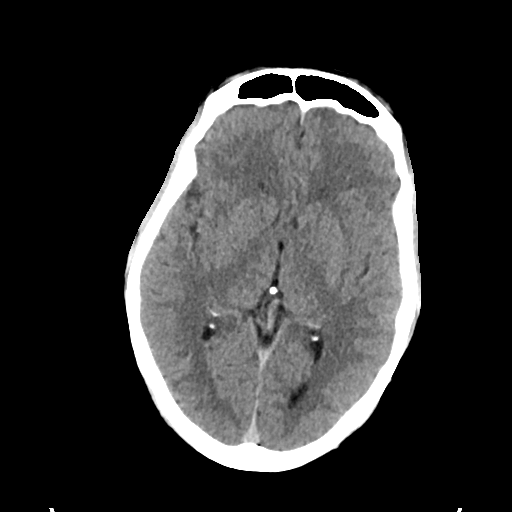
[im 20/32  brain]
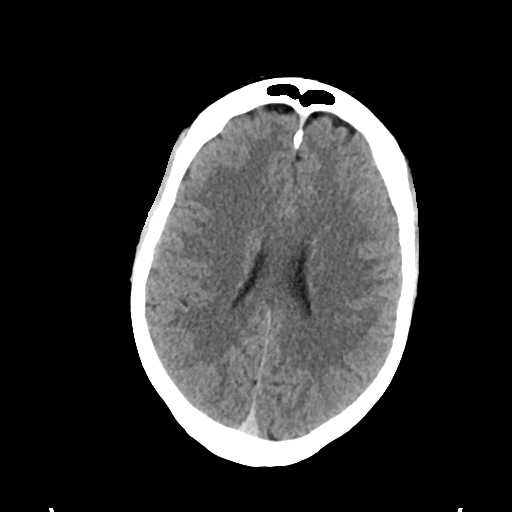
[im 20/32  bone]
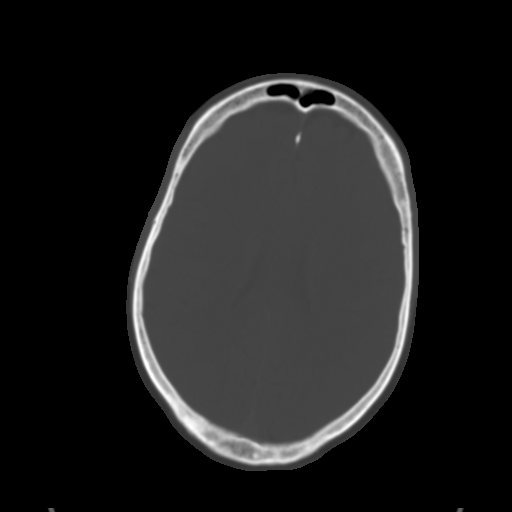
[im 24/32  brain]
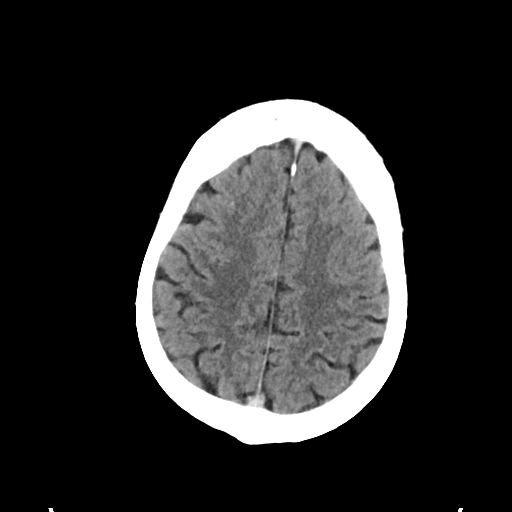
[im 28/32  brain]
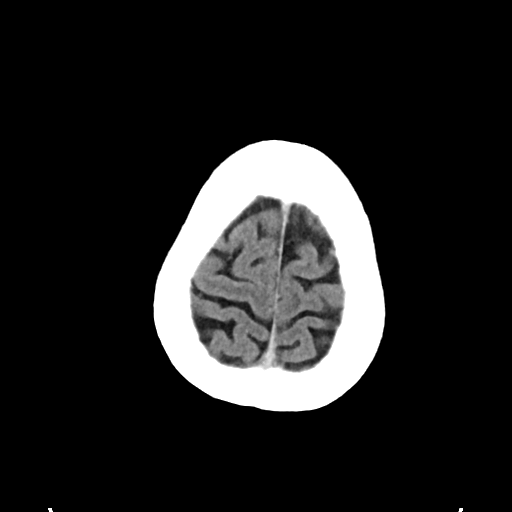

[Series 3: head bone · axial · 0.49mm/px · z∈[-52,+4]mm · 4 of 79 slices shown]
[im 8/79  bone]
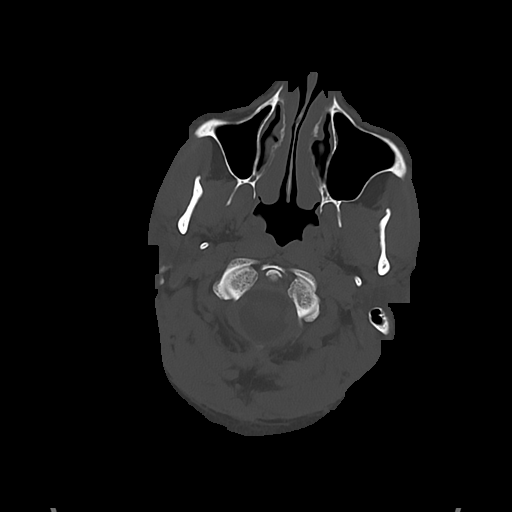
[im 16/79  bone]
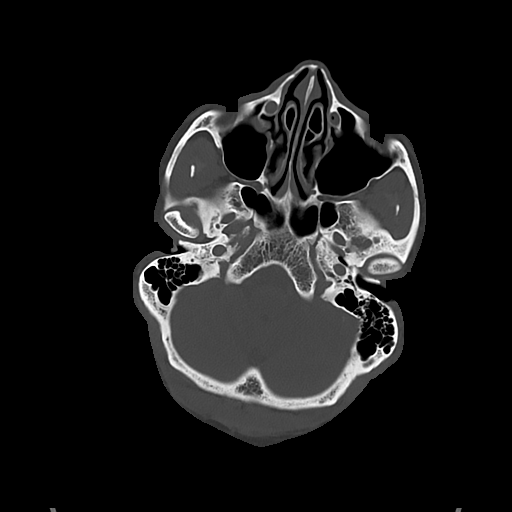
[im 24/79  bone]
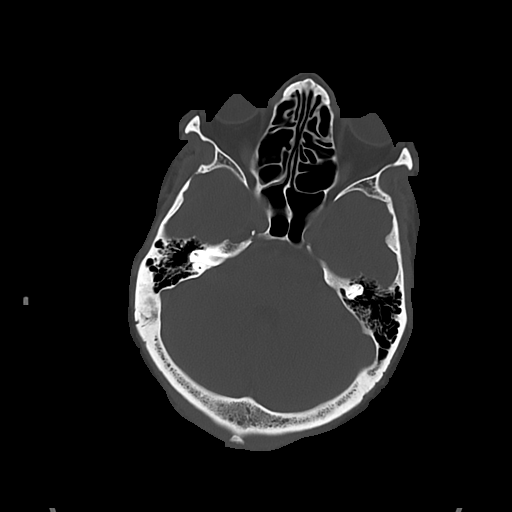
[im 36/79  bone]
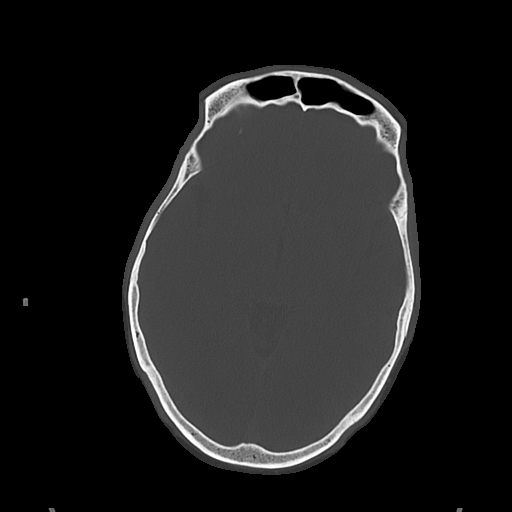

[Series 4: cor soft · coronal · 0.35mm/px · 3 of 74 slices shown]
[im 25/74  brain]
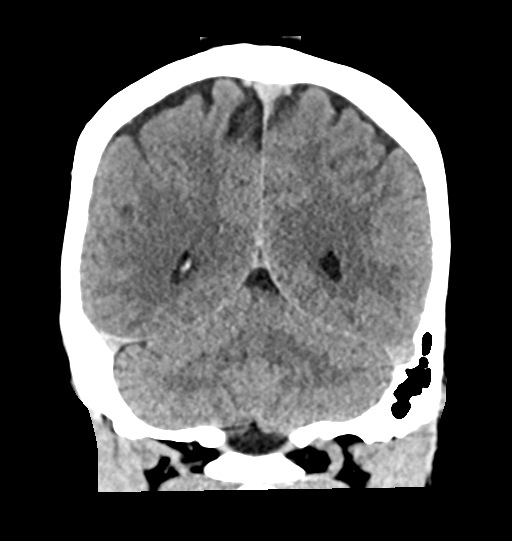
[im 33/74  brain]
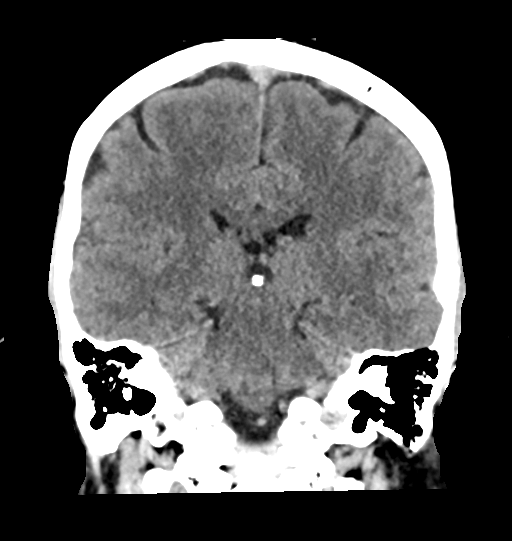
[im 41/74  brain]
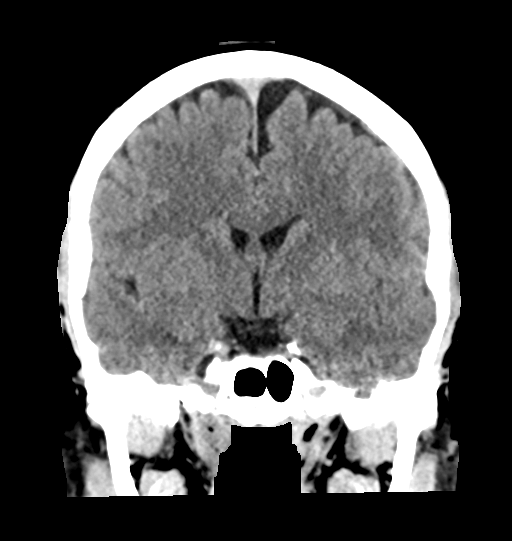

[Series 5: sag soft · sagittal · 0.36mm/px · 3 of 61 slices shown]
[im 21/61  brain]
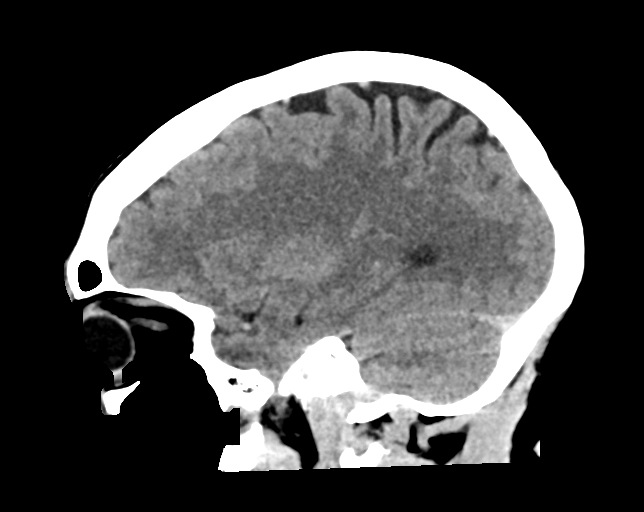
[im 31/61  brain]
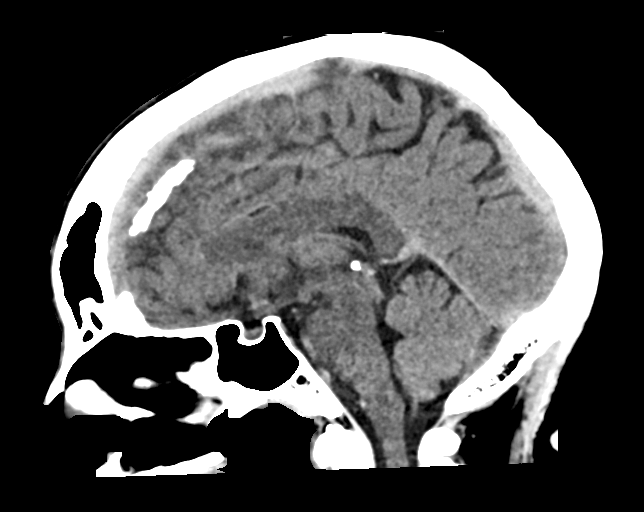
[im 41/61  brain]
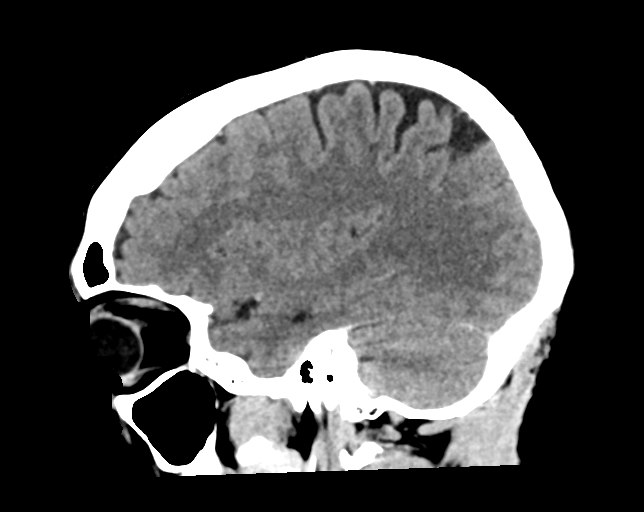

[17 of 47 positions shown; findings below may reference images not displayed]

FINDINGS: Brain: No evidence of acute infarction, hemorrhage, hydrocephalus,
extra-axial collection or mass lesion/mass effect.

Vascular: No hyperdense vessel is noted.

Skull: Normal. Negative for fracture or focal lesion.

Sinuses/Orbits: No acute finding.

Other: None.
IMPRESSION: No CT evidence of acute intracranial abnormality.

## 2022-04-16 IMAGING — US US EXTREM LOW VENOUS
1 series · 14 of 24 positions shown · non-contrast
Comparison: None.

CLINICAL DATA: Positive D-dimers.  COVID positive.

EXAM:
Bilateral LOWER EXTREMITY VENOUS DOPPLER ULTRASOUND
TECHNIQUE: Gray-scale sonography with compression, as well as color and duplex
ultrasound, were performed to evaluate the deep venous system(s)
from the level of the common femoral vein through the popliteal and
proximal calf veins.

[Series 1: us venous img lower bilat (dvt) · portal-venous · 14 of 56 slices shown]
[im 1/56]
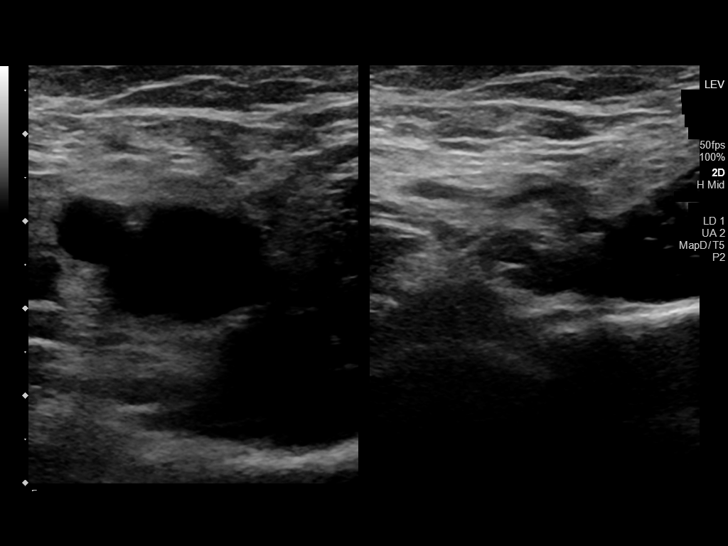
[im 5/56]
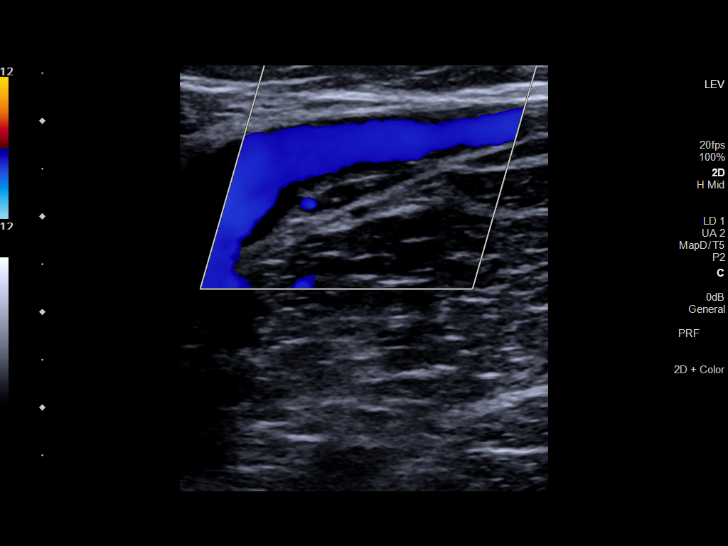
[im 10/56]
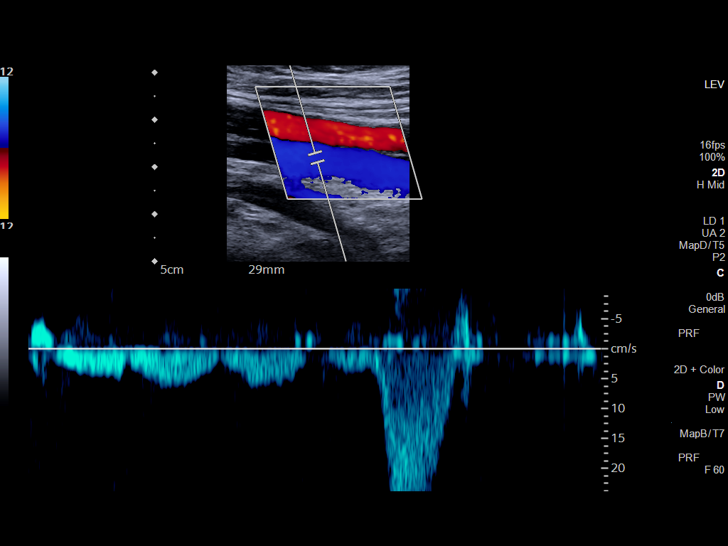
[im 15/56]
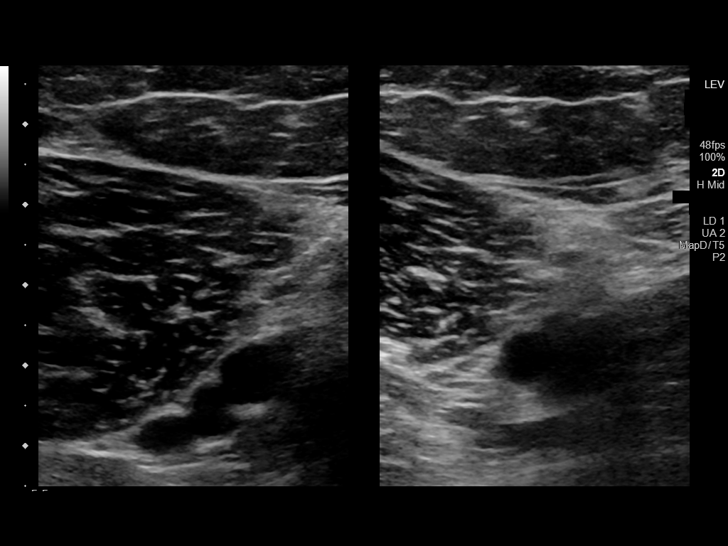
[im 17/56]
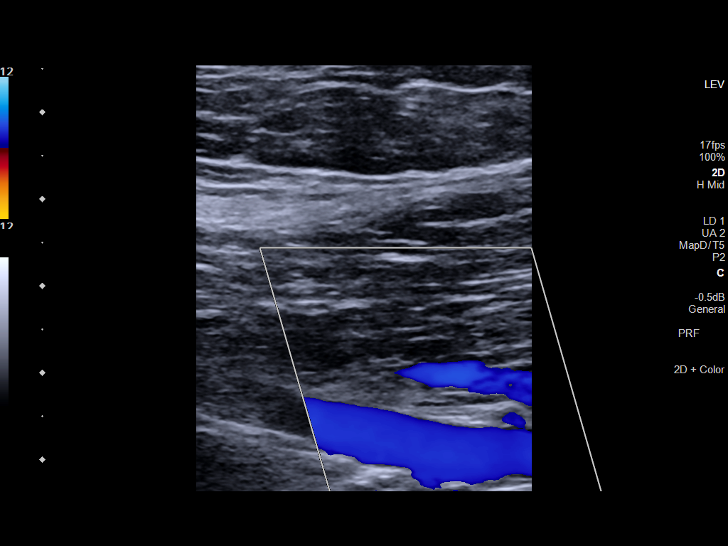
[im 22/56]
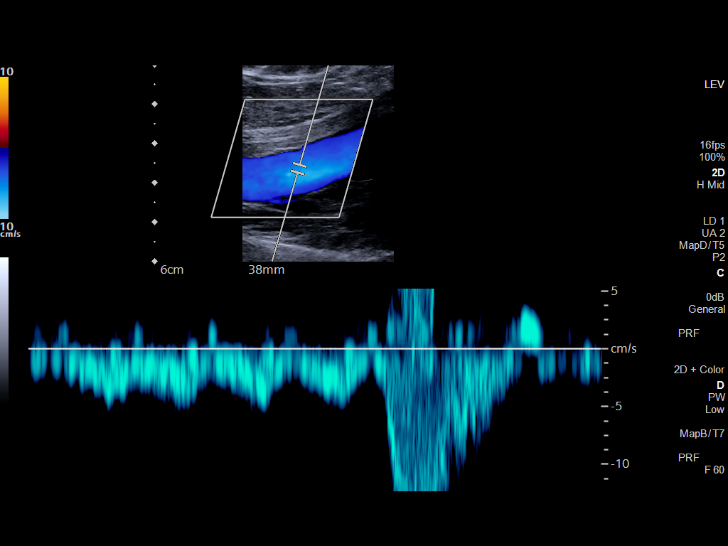
[im 27/56]
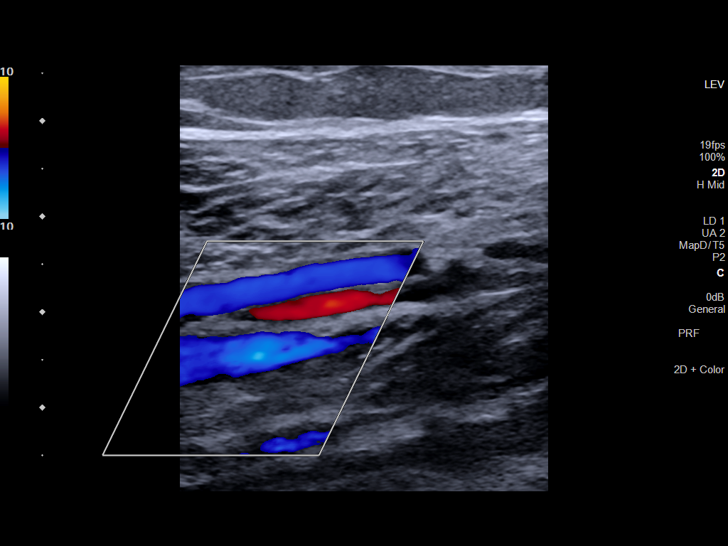
[im 29/56]
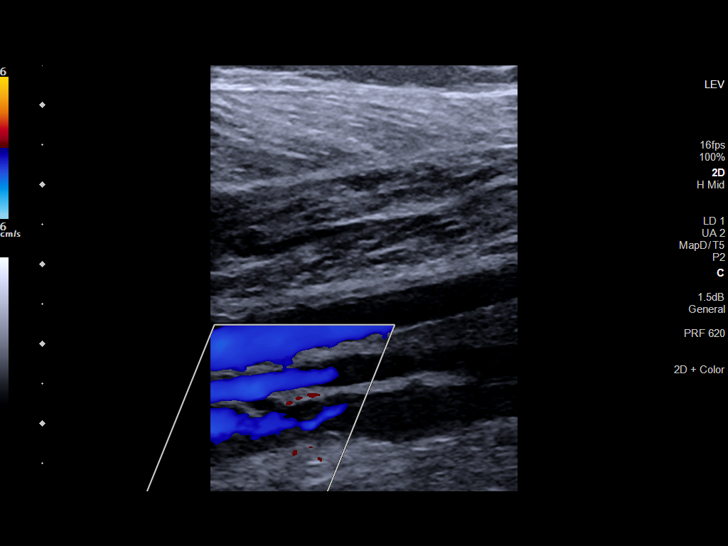
[im 34/56]
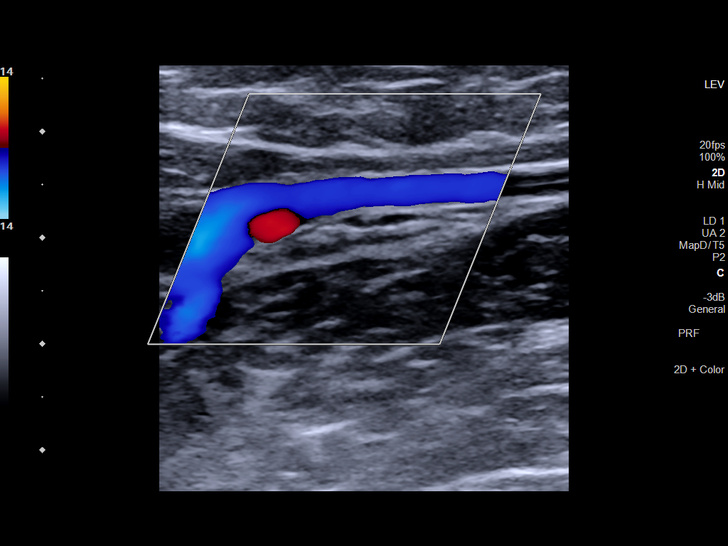
[im 39/56]
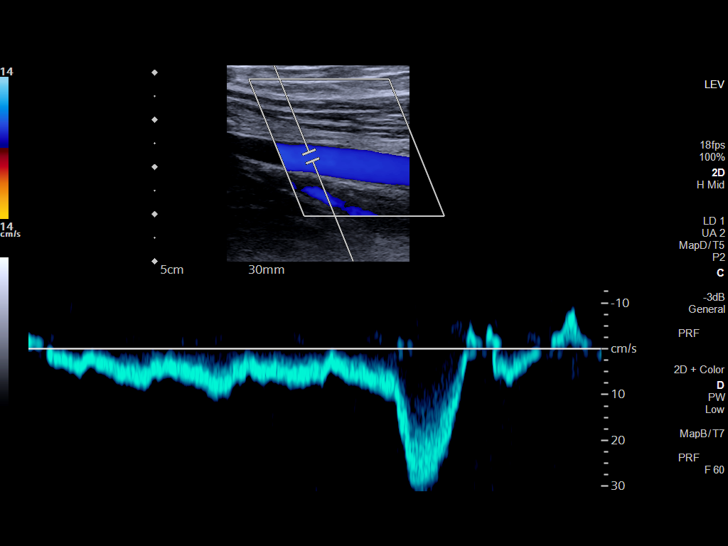
[im 44/56]
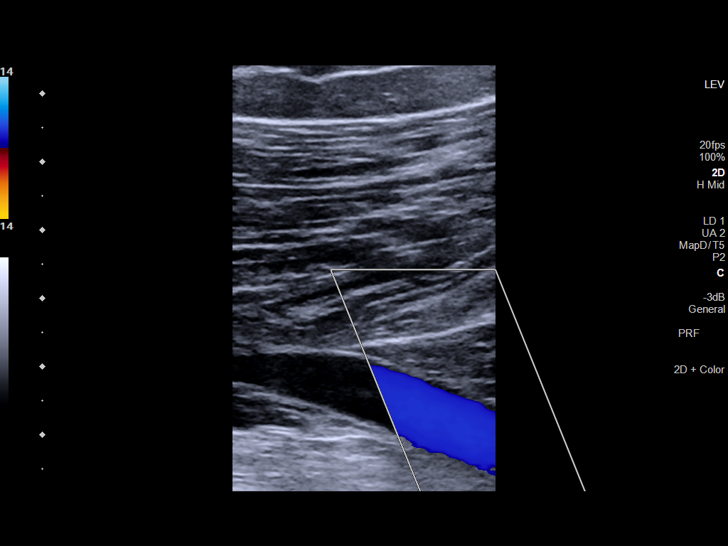
[im 46/56]
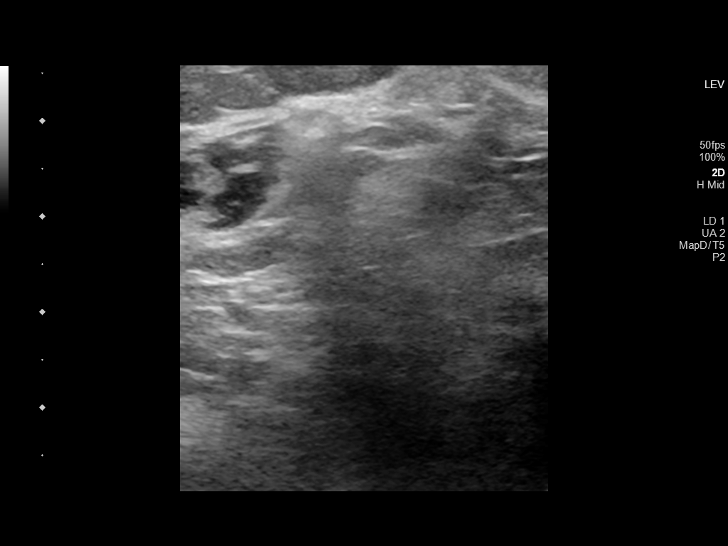
[im 51/56]
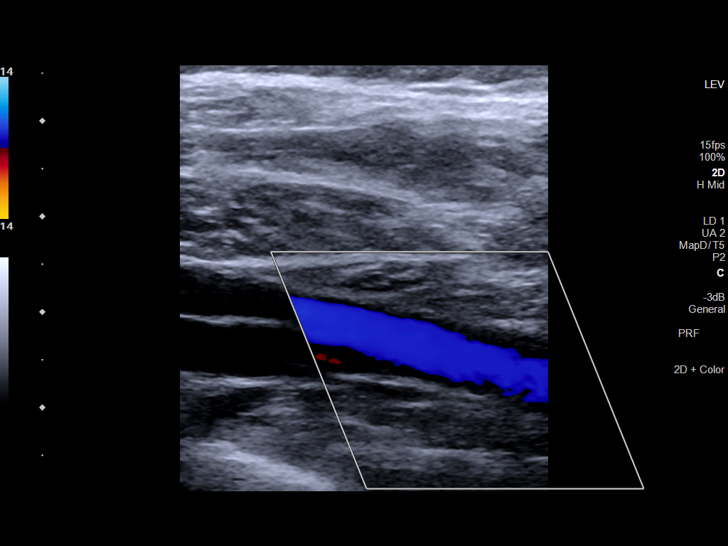
[im 56/56]
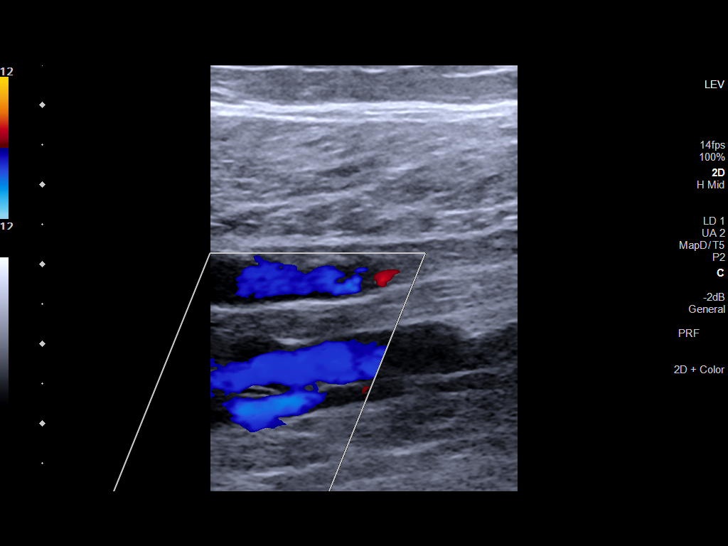

[14 of 24 positions shown; findings below may reference images not displayed]

FINDINGS: VENOUS

Normal compressibility of the common femoral, superficial femoral,
and popliteal veins, as well as the visualized calf veins.
Visualized portions of profunda femoral vein and great saphenous
vein unremarkable. No filling defects to suggest DVT on grayscale or
color Doppler imaging. Doppler waveforms show normal direction of
venous flow, normal respiratory plasticity and response to
augmentation.

Limited views of the contralateral common femoral vein are
unremarkable.

OTHER

None.

Limitations: none
IMPRESSION: Negative.

## 2022-04-16 IMAGING — DX DG CHEST 1V PORT
1 series · 1 of 1 positions shown · non-contrast
Comparison: Chest x-ray [DATE].

CLINICAL DATA: 55-year-old male with history of recurrent near
syncopal feeling. Dizziness. Cough.

EXAM:
PORTABLE CHEST 1 VIEW

[chest ap]
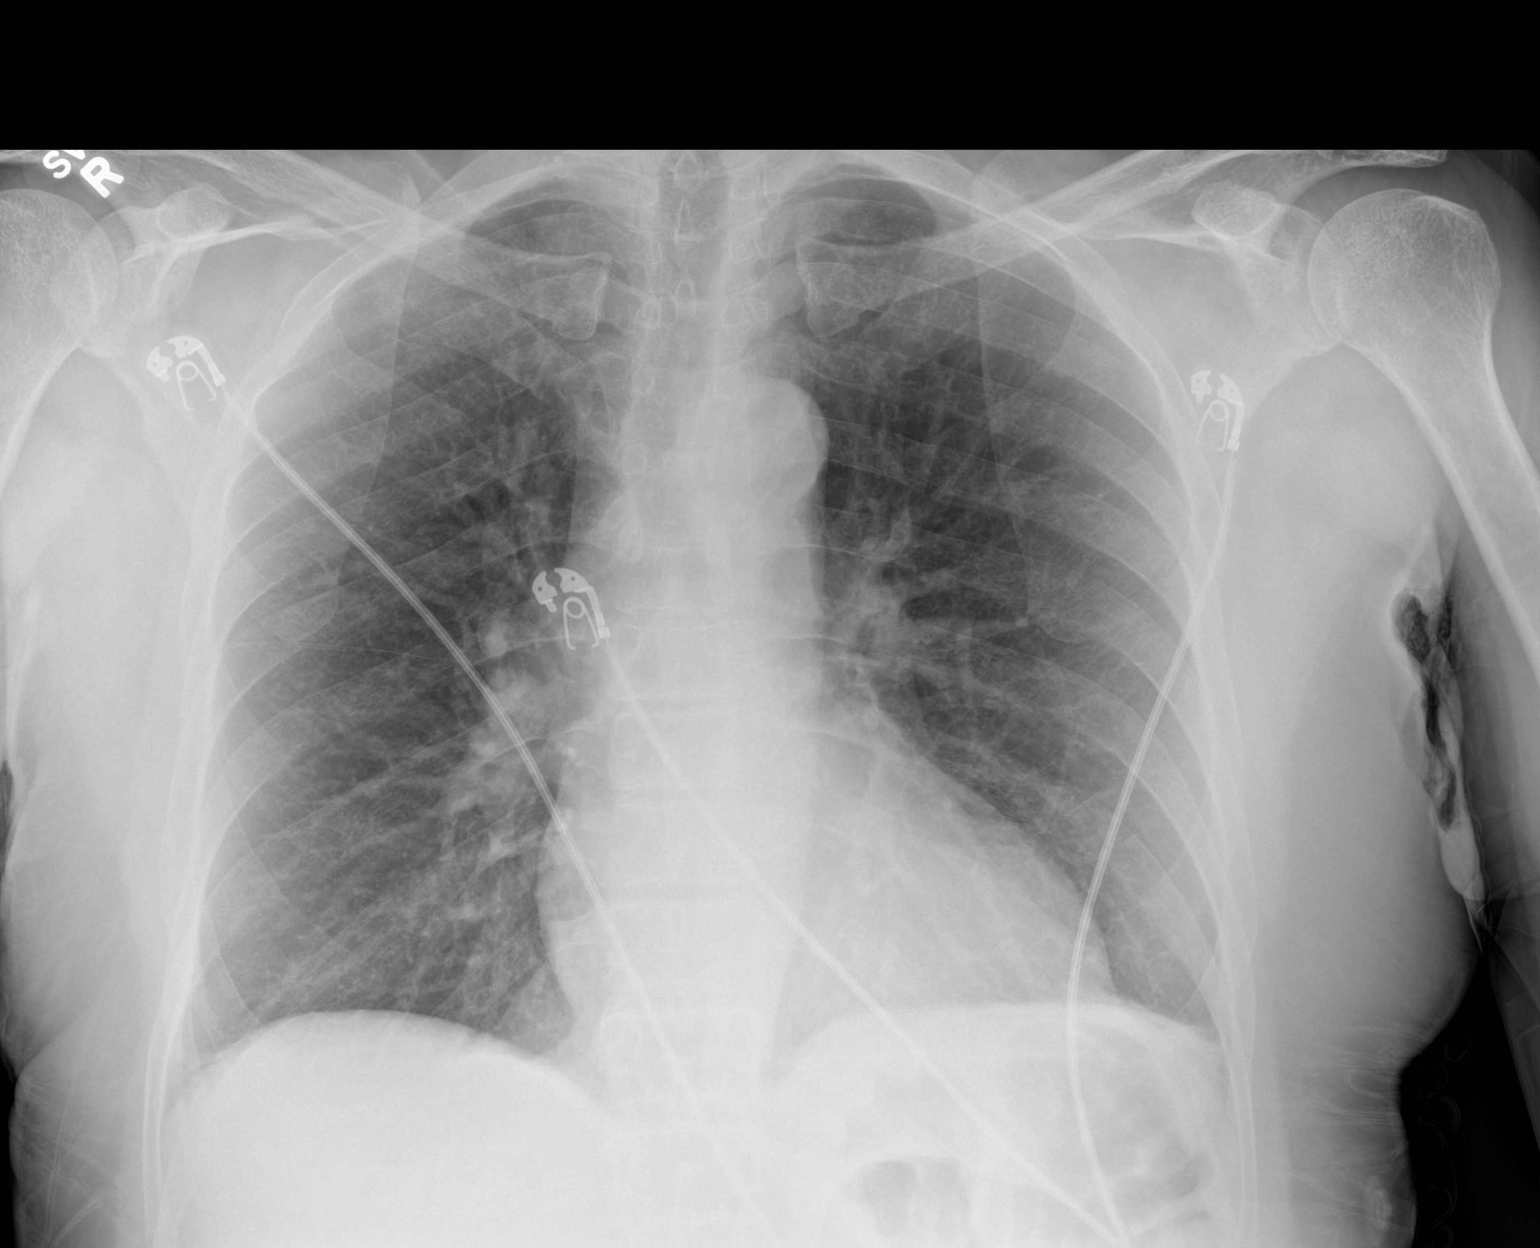

[1 of 1 positions shown; findings below may reference images not displayed]

FINDINGS: Lung volumes are normal. No consolidative airspace disease. No
pleural effusions. No pneumothorax. No pulmonary nodule or mass
noted. Pulmonary vasculature and the cardiomediastinal silhouette
are within normal limits.
IMPRESSION: No radiographic evidence of acute cardiopulmonary disease.

## 2022-04-16 IMAGING — CT CT ANGIO CHEST
2 of 7 series · 18 of 46 positions shown · IV contrast (APPLIED)
Comparison: Portable chest today, PA Lat chest [DATE], chest CT
with contrast [DATE], chest CT without contrast [DATE].

CLINICAL DATA: Positive edema hyper, pulmonary embolism suspected.
Near syncopal episode. Frequent dizzy spells.

EXAM:
CT ANGIOGRAPHY CHEST WITH CONTRAST
TECHNIQUE: Multidetector CT imaging of the chest was performed using the
standard protocol during bolus administration of intravenous
contrast. Multiplanar CT image reconstructions and MIPs were
obtained to evaluate the vascular anatomy.

[Series 6: thins · axial · 0.63mm/px · z∈[+1279,+1508]mm · 15 of 370 slices shown]
[im 21/370  lung]
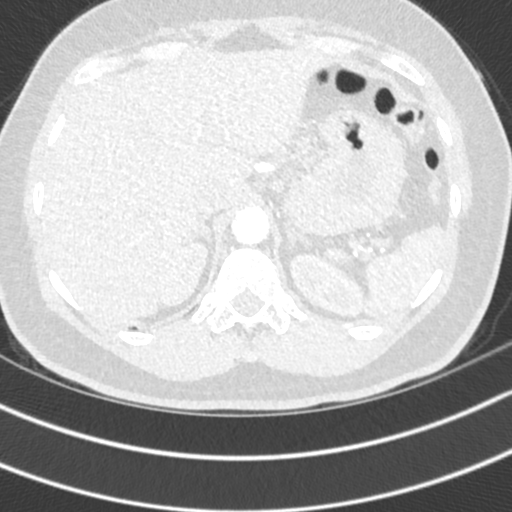
[im 42/370  soft-tissue]
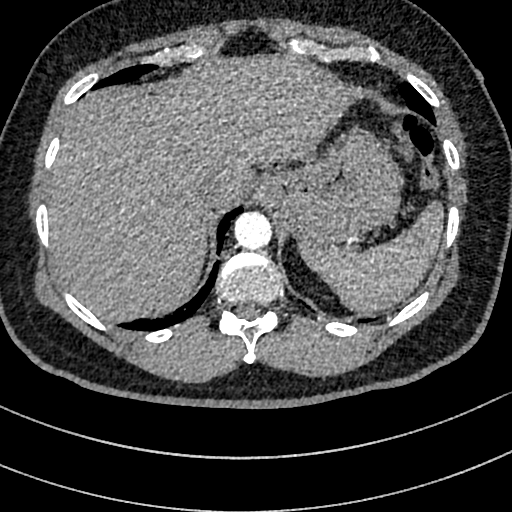
[im 62/370  lung]
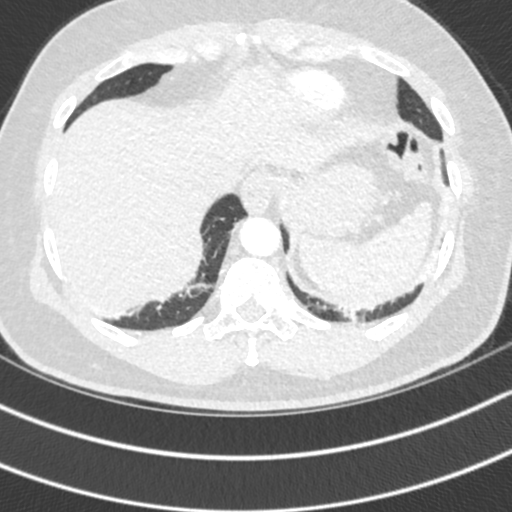
[im 83/370  soft-tissue]
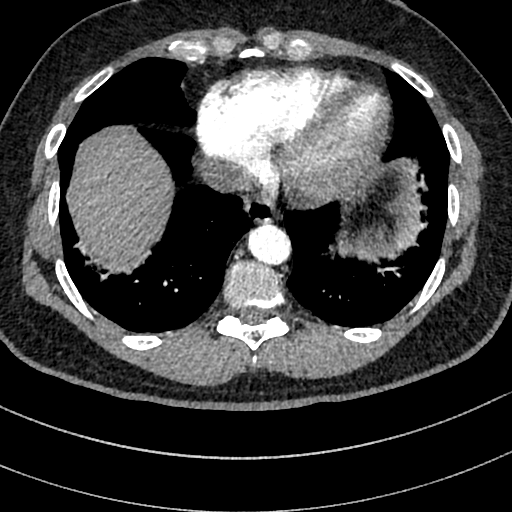
[im 124/370  lung]
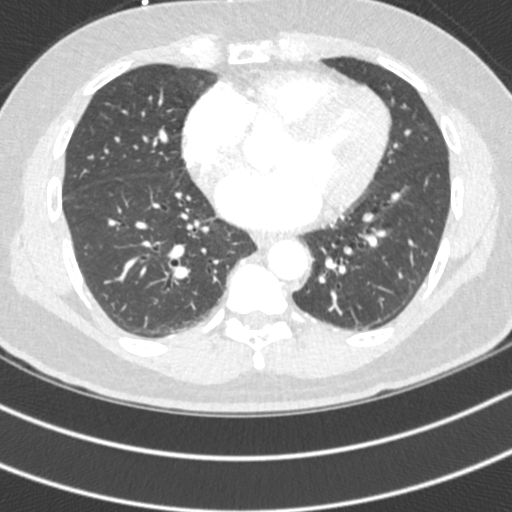
[im 144/370  soft-tissue]
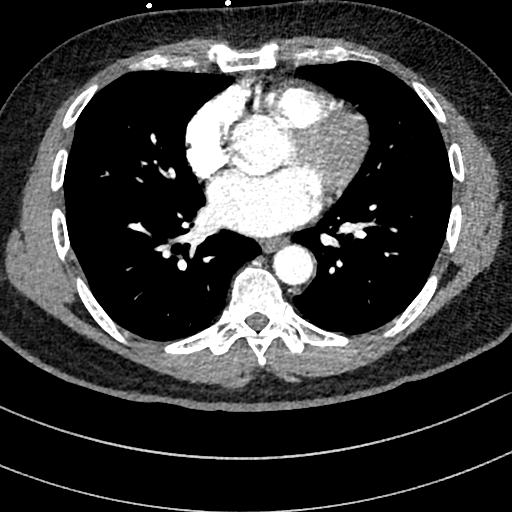
[im 165/370  lung]
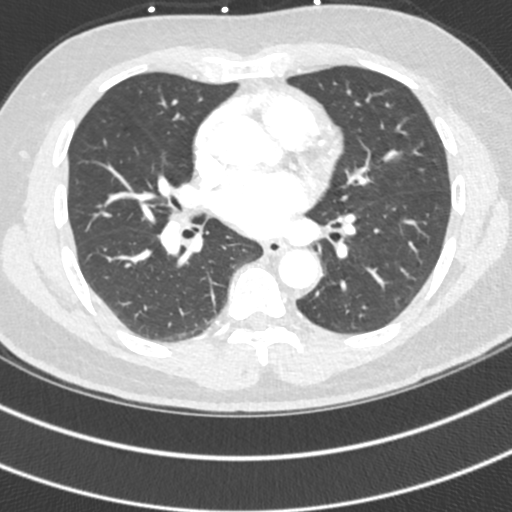
[im 185/370  soft-tissue]
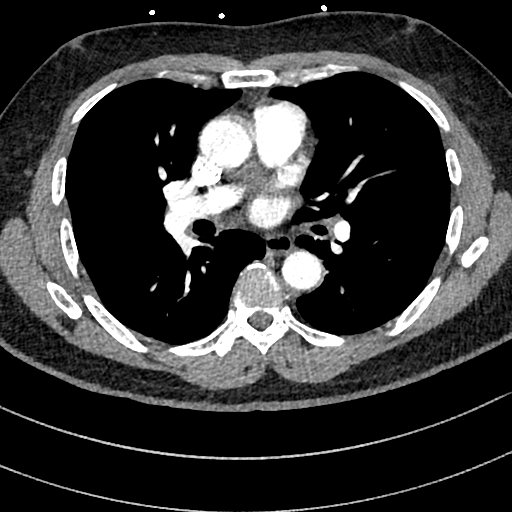
[im 206/370  lung]
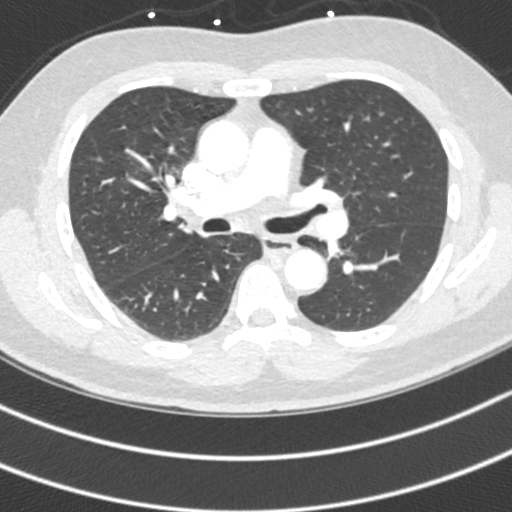
[im 226/370  soft-tissue]
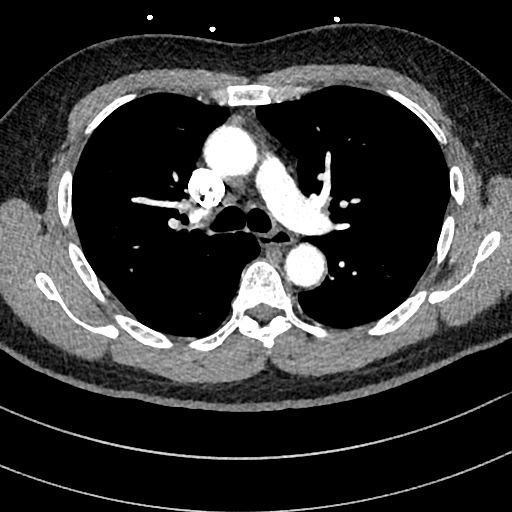
[im 247/370  lung]
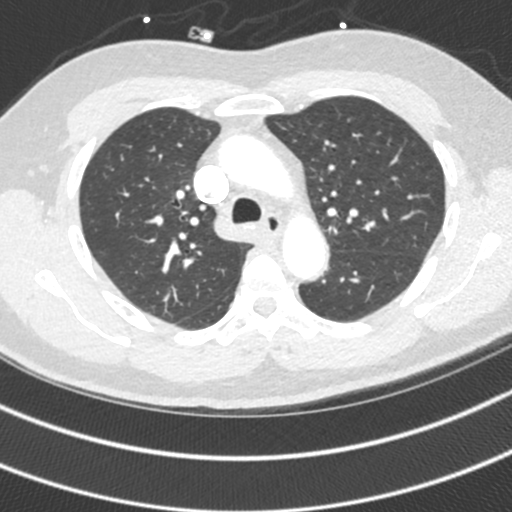
[im 288/370  soft-tissue]
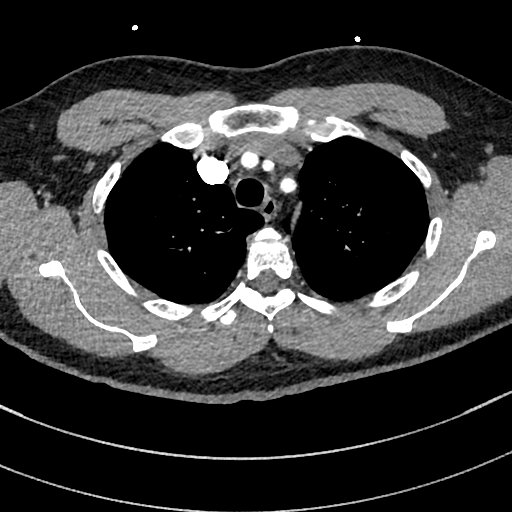
[im 308/370  lung]
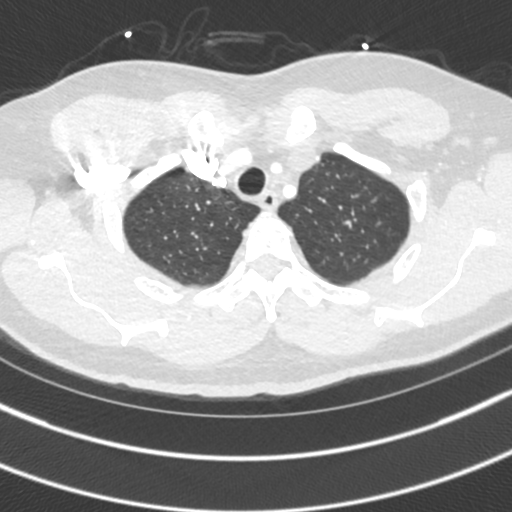
[im 329/370  soft-tissue]
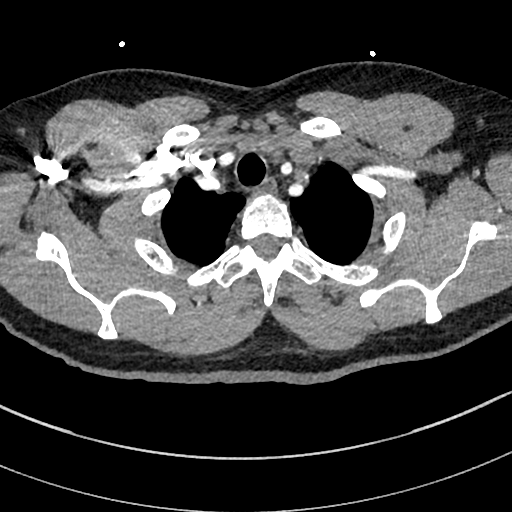
[im 349/370  lung]
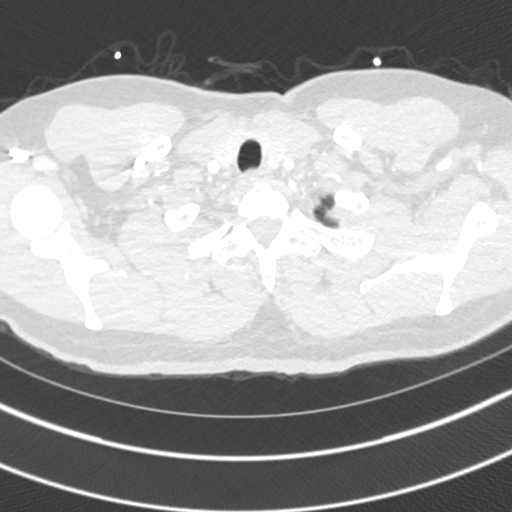

[Series 7: cor · coronal · 0.56mm/px · 3 of 139 slices shown]
[im 35/139  soft-tissue]
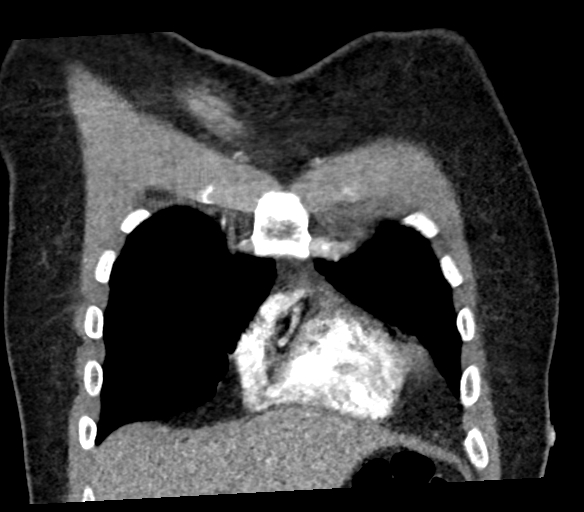
[im 70/139  soft-tissue]
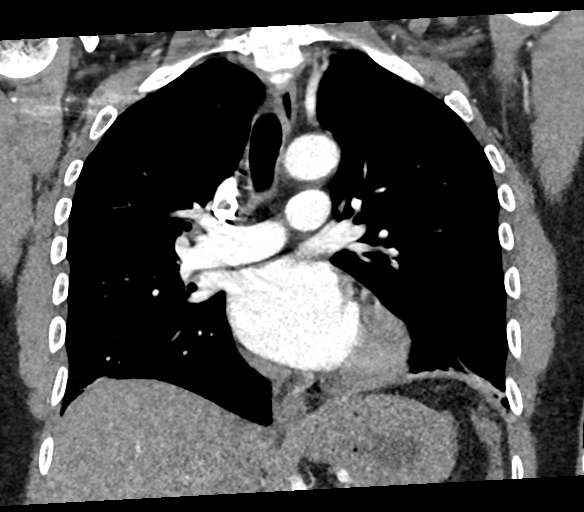
[im 104/139  soft-tissue]
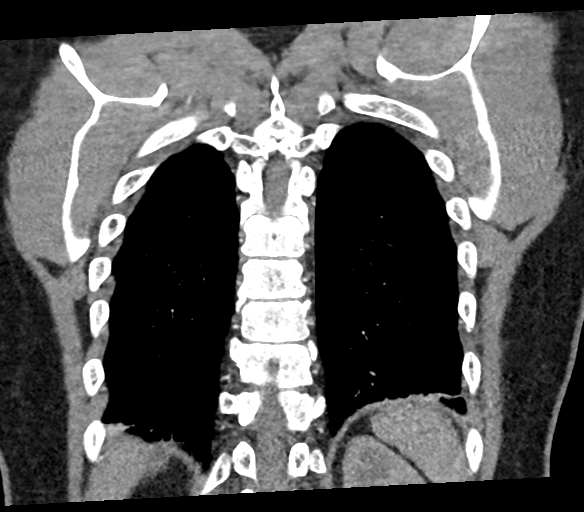

[18 of 46 positions shown; findings below may reference images not displayed]

RADIATION DOSE REDUCTION: This exam was performed according to the
departmental dose-optimization program which includes automated
exposure control, adjustment of the mA and/or kV according to
patient size and/or use of iterative reconstruction technique.

CONTRAST:  75mL OMNIPAQUE IOHEXOL 350 MG/ML SOLN
FINDINGS: Cardiovascular: The cardiac size is normal. The pulmonary arteries
are normal in caliber without evidence thromboemboli.

In the posteromedial right atrium just above the inferior cavoatrial
junction there is a hypodense, ovoid slightly lobular mass measuring
3.8 x 2.5 by 2.7 cm and 59.1 Hounsfield units, not seen on prior
contrast exam and could be a blood clot, neoplasm or tumor thrombus.

Since this is immediately above the inferior cavoatrial junction,
patency of the IVC cannot be established, but if the IVC is occluded
then I do not see upper abdominal compensatory varices.

The aorta and great vessels are within normal limits. There is no
visible coronary artery calcification. The pulmonary veins are
decompressed.

Mediastinum/Nodes: No enlarged mediastinal, hilar, or axillary lymph
nodes. The lower poles of the thyroid gland, the trachea and
esophagus demonstrate no significant findings.

Lungs/Pleura: There is increased linear atelectasis in the lung
bases. Central airways and lungs are otherwise clear. There is no
pleural effusion, thickening or pneumothorax.

Upper Abdomen: No acute abnormality.

Musculoskeletal: No chest wall mass or acute or suspicious osseous
findings. Mild thoracic spondylosis.

Review of the MIP images confirms the above findings.
IMPRESSION: 1. 3.8 x 2.5 x 2.7 cm hypodense mass in the posteromedial right
atrium just above the inferior cavoatrial junction, not seen on
[DATE], statistically most likely a blood clot but primary
neoplasm or tumor thrombus are also possibilities. Echocardiography
or MRI without and with contrast may be helpful for further
evaluation.
2. Patency of the IVC can't be confirmed, but if the IVC is occluded
then there are no chest wall or other compensatory varices visible
at this time.
3. No evidence of pulmonary arterial dilatation or embolic arterial
filling defect.

## 2022-04-16 MED ORDER — IOHEXOL 350 MG/ML SOLN
75.0000 mL | Freq: Once | INTRAVENOUS | Status: AC | PRN
  Administered 2022-04-16: 75 mL via INTRAVENOUS

## 2022-04-16 MED ORDER — HEPARIN SODIUM (PORCINE) 5000 UNIT/ML IJ SOLN
5000.0000 [IU] | Freq: Three times a day (TID) | INTRAMUSCULAR | Status: DC
Start: 2022-04-16 — End: 2022-04-17
  Administered 2022-04-16 – 2022-04-17 (×3): 5000 [IU] via SUBCUTANEOUS
  Filled 2022-04-16 (×2): qty 1

## 2022-04-16 MED ORDER — ONDANSETRON HCL 4 MG/2ML IJ SOLN: 4.0000 mg | Freq: Three times a day (TID) | INTRAMUSCULAR | Status: DC | PRN

## 2022-04-16 MED ORDER — LACTATED RINGERS IV BOLUS
1000.0000 mL | Freq: Once | INTRAVENOUS | Status: AC
  Administered 2022-04-16: 1000 mL via INTRAVENOUS

## 2022-04-16 MED ORDER — PERFLUTREN LIPID MICROSPHERE
1.0000 mL | INTRAVENOUS | Status: AC | PRN
  Administered 2022-04-16: 6 mL via INTRAVENOUS
  Filled 2022-04-16: qty 10

## 2022-04-16 MED ORDER — ALBUTEROL SULFATE (2.5 MG/3ML) 0.083% IN NEBU: 2.5000 mg | INHALATION_SOLUTION | RESPIRATORY_TRACT | Status: DC | PRN

## 2022-04-16 MED ORDER — ONDANSETRON 4 MG PO TBDP: 4.0000 mg | ORAL_TABLET | Freq: Three times a day (TID) | ORAL | 0 refills | Status: AC | PRN

## 2022-04-16 MED ORDER — HEPARIN BOLUS VIA INFUSION
4000.0000 [IU] | Freq: Once | INTRAVENOUS | Status: DC
  Filled 2022-04-16: qty 4000

## 2022-04-16 MED ORDER — ACETAMINOPHEN 325 MG PO TABS
650.0000 mg | ORAL_TABLET | Freq: Four times a day (QID) | ORAL | Status: DC | PRN
  Administered 2022-04-16 – 2022-04-17 (×3): 650 mg via ORAL
  Filled 2022-04-16 (×3): qty 2

## 2022-04-16 MED ORDER — DM-GUAIFENESIN ER 30-600 MG PO TB12
1.0000 | ORAL_TABLET | Freq: Two times a day (BID) | ORAL | Status: DC | PRN
  Administered 2022-04-16: 1 via ORAL
  Filled 2022-04-16: qty 1

## 2022-04-16 MED ORDER — HEPARIN SODIUM (PORCINE) 5000 UNIT/ML IJ SOLN
5000.0000 [IU] | Freq: Three times a day (TID) | INTRAMUSCULAR | Status: DC
  Administered 2022-04-16: 5000 [IU] via SUBCUTANEOUS
  Filled 2022-04-16: qty 1

## 2022-04-16 MED ORDER — HEPARIN (PORCINE) 25000 UT/250ML-% IV SOLN: 1400.0000 [IU]/h | INTRAVENOUS | Status: DC

## 2022-04-16 MED ORDER — NIRMATRELVIR/RITONAVIR (PAXLOVID)TABLET: 3.0000 | ORAL_TABLET | Freq: Two times a day (BID) | ORAL | 0 refills | Status: AC

## 2022-04-16 NOTE — ED Notes (Signed)
Pt at CT

## 2022-04-16 NOTE — ED Notes (Signed)
Paramedic, Les attempted to draw blood work off existing IV with no success, This RN requested we place new IV or straight stick pt, pt "refuses anymore blood draws", this RN will update MD.  ?

## 2022-04-16 NOTE — Plan of Care (Signed)
  Problem: Education: Goal: Knowledge of General Education information will improve Description Including pain rating scale, medication(s)/side effects and non-pharmacologic comfort measures Outcome: Progressing   Problem: Health Behavior/Discharge Planning: Goal: Ability to manage health-related needs will improve Outcome: Progressing   

## 2022-04-16 NOTE — Assessment & Plan Note (Signed)
Patient's not taking medications currently ?-Observe closely ?

## 2022-04-16 NOTE — ED Notes (Signed)
Pt does not want a new needle stick for blood. Pts current IV will not pull back for bloodwork. Discussed new IV with Pt. Pt does not want a new IV. ?

## 2022-04-16 NOTE — ED Notes (Signed)
Pt given lunch tray after stating he has been hungry since 0700, pt refused lunch tray "because the ultrasound guy said not to eat in case we need to do more test". Pt educated this is MD order and if he wants to eat he can if he chooses to not eat then I would remove the tray from the room. Pt requests this RN leaves it at bedside.  ?

## 2022-04-16 NOTE — Assessment & Plan Note (Addendum)
Etiology is not clear, blood clot versus tumor thrombus. Consulted Dr. Clayborn Bigness ?-will admit to PCU as inpt ?-start IV heparin empirically.  This is discussed with Dr. Clayborn Bigness, and he is Okay to treat patient empirically with IV heparin now and waiting for 2D echo, but pt refused to start IV heparin empirically. He wants to have 2d echo result for sure to have blood blot before starting IV heparin. ?-Follow-up 2D echo ?-PT/OT ? ?Addendum: 2d echo showed no obvious cardiac mass. EF 65-70%. ?-will d/c IV heparin  ?-per Dr. Clayborn Bigness, consider TEE if unable to obtain MRI of cardiac/chest, but this may be done as an outpatient. MRI of cardiac/chest can not be done in [facility]. ? ? ?
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
## 2022-04-16 NOTE — Assessment & Plan Note (Addendum)
Etiology is not clear.  No focal neurologic deficit on physical examination.  CT head negative.  CT angiogram negative for PE, but showed possible right atrial mass. 2D echo did not confirm presence of the mass. The etiology is still not clear. ?-Frequent neuro checks ?-Orthostatic vital signs ?-IV fluid: 1 L of LR, then 75 cc/h of normal saline ?-pt/ot ?-Will get MRI of brain to rule out stroke ? ?
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
## 2022-04-16 NOTE — Progress Notes (Signed)
*  PRELIMINARY RESULTS* ?Echocardiogram ?2D Echocardiogram with contrast has been performed. ? ?Stephen Mejia ?04/16/2022, 1:22 PM ?

## 2022-04-16 NOTE — Discharge Instructions (Addendum)
QUARANTINE INSTRUCTION ? ?Follow these instructions at home: ? ?Protecting others ?To avoid spreading the illness to other people: ?Quarantine in your home for 5 days ?Household members: ?With no symptoms: who are vaccinated do not need to quarantine but are strongly encouraged to use masks to prevent infection.  ?With symptoms: should get tested and if positive should quarantine for 5 days. ?Wash your hands often with soap and water. If soap and water are not available, use an alcohol-based hand sanitizer. If you have not cleaned your hands, do not touch your face. ?Make sure that all people in your household wash their hands well and often. ?Cover your nose and mouth when you cough or sneeze. ?Throw away used tissues. ?Stay home if you have any cold-like or flu-like symptoms. ?General instructions ?Go to your local pharmacy and buy a pulse oximeter (this is a machine that measures your oxygen). Check your oxygen levels at least 3 times a day. If your oxygen level is 90% or less return to the emergency room immediately ?Take over-the-counter and prescription medicines only as told by your health care provider. ?If you need medication for fever take tylenol or ibuprofen ?Drink enough fluid to keep your urine pale yellow. ?Rest at home as directed by your health care provider. ?Do not give aspirin to a child with the flu, because of the association with Reye's syndrome. ?Do not use tobacco products, including cigarettes, chewing tobacco, and e-cigarettes. If you need help quitting, ask your health care provider. ?Keep all follow-up visits as told by your health care provider. This is important. ?How is this prevented? ?Avoid areas where an outbreak has been reported. ?Avoid large groups of people. ?Keep a safe distance from people who are coughing and sneezing. ?Do not touch your face if you have not cleaned your hands. ?When you are around people who are sick or might be sick, wear a mask to protect  yourself. ?Contact a health care provider if: ?You have symptoms of SARS (cough, fever, chest pain, shortness of breath) that are not getting better at home. ?You have a fever. ?If you have difficulty breathing go to your local ER or call 911  ? ?To help boost your immune system against COVID-19, please take: ? ?- Vitamin D3 4,000 IU/day ?- Vitamin C 500-1,000?mg twice a day ?- Quercetin 250?mg twice a day ?- Zinc 100?mg/day ?- Melatonin 10?mg before bedtime (causes drowsiness) ?- Aspirin 325?mg/day (unless contraindicated) ?- Pulse Oximeter Monitoring of oxygen saturation is recommended - check your oxygen 3 times a day if less than 90% return to the ER ? ?These medications are all over-the-counter and do not need a prescription. ? ?
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
## 2022-04-16 NOTE — Assessment & Plan Note (Signed)
Patient is positive for COVID-19 test, but no oxygen desaturation.  Chest x-ray is negative for infiltration.  Patient does not have shortness of breath, fever or chills. ?-Supportive care ?-As needed albuterol for shortness of breath and Mucinex for cough ?

## 2022-04-17 DIAGNOSIS — I5189 Other ill-defined heart diseases: Secondary | ICD-10-CM | POA: Diagnosis not present

## 2022-04-17 DIAGNOSIS — U071 COVID-19: Secondary | ICD-10-CM | POA: Diagnosis not present

## 2022-04-17 DIAGNOSIS — R55 Syncope and collapse: Secondary | ICD-10-CM | POA: Diagnosis not present

## 2022-04-17 LAB — GLUCOSE, CAPILLARY: Glucose-Capillary: 82 mg/dL (ref 70–99)
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
## 2022-04-17 NOTE — Progress Notes (Signed)
[facility] Cardiology ? ? ? ?SUBJECTIVE: Patient states to be doing reasonably well no significant chest pain no palpitation no tachycardia no near syncope patient was found to be COVID-positive with fever now hopefully will be discharged home to quarantine at home and follow-up within a week or 2 to cardiology and hopefully for cardiac MRI ? ? ?Vitals:  ? 04/16/22 2025 04/16/22 2334 04/17/22 0412 04/17/22 0840  ?BP: 127/69 (!) 98/57 126/65 97/80  ?Pulse: 92 86 79 78  ?Resp: '20  20 16  '$ ?Temp: (!) 101.3 ?F (38.5 ?C) 100 ?F (37.8 ?C) 100.1 ?F (37.8 ?C) 98.8 ?F (37.1 ?C)  ?TempSrc: Oral Oral Oral   ?SpO2: 100% 95% 95% 97%  ?Weight:      ?Height:      ? ? ? ?Intake/Output Summary (Last 24 hours) at 04/17/2022 1206 ?Last data filed at 04/16/2022 1844 ?Gross per 24 hour  ?Intake 480 ml  ?Output 650 ml  ?Net -170 ml  ? ? ? ? ?PHYSICAL EXAM ? ?General: Well developed, well nourished, in no acute distress ?HEENT:  Normocephalic and atramatic ?Neck:  No JVD.  ?Lungs: Clear bilaterally to auscultation and percussion. ?Heart: HRRR . Normal S1 and S2 without gallops or murmurs.  ?Abdomen: Bowel sounds are positive, abdomen soft and non-tender  ?Msk:  Back normal, normal gait. Normal strength and tone for age. ?Extremities: No clubbing, cyanosis or edema.   ?Neuro: Alert and oriented X 3. ?Psych:  Good affect, responds appropriately ? ? ?LABS: ?Basic Metabolic Panel: ?Recent Labs  ?  04/16/22 ?0041  ?NA 137  ?K 3.6  ?CL 105  ?CO2 24  ?GLUCOSE 107*  ?BUN 14  ?CREATININE 1.03  ?CALCIUM 9.2  ? ?Liver Function Tests: ?No results for input(s): AST, ALT, ALKPHOS, BILITOT, PROT, ALBUMIN in the last 72 hours. ?No results for input(s): LIPASE, AMYLASE in the last 72 hours. ?CBC: ?Recent Labs  ?  04/16/22 ?0041  ?WBC 9.1  ?HGB 13.1  ?HCT 38.6*  ?MCV 91.7  ?PLT 220  ? ?Cardiac Enzymes: ?No results for input(s): CKTOTAL, CKMB, CKMBINDEX, TROPONINI in the last 72 hours. ?BNP: ?Invalid input(s): POCBNP ?D-Dimer: ?Recent Labs  ?  04/16/22 ?8338  ?DDIMER  1.21*  ? ?Hemoglobin A1C: ?No results for input(s): HGBA1C in the last 72 hours. ?Fasting Lipid Panel: ?No results for input(s): CHOL, HDL, LDLCALC, TRIG, CHOLHDL, LDLDIRECT in the last 72 hours. ?Thyroid Function Tests: ?Recent Labs  ?  04/16/22 ?0332  ?TSH 0.646  ? ?Anemia Panel: ?No results for input(s): VITAMINB12, FOLATE, FERRITIN, TIBC, IRON, RETICCTPCT in the last 72 hours. ? ?CT HEAD WO CONTRAST (5MM) ? ?Result Date: 04/16/2022 ?CLINICAL DATA:  Syncope. EXAM: CT HEAD WITHOUT CONTRAST TECHNIQUE: Contiguous axial images were obtained from the base of the skull through the vertex without intravenous contrast. RADIATION DOSE REDUCTION: This exam was performed according to the departmental dose-optimization program which includes automated exposure control, adjustment of the mA and/or kV according to patient size and/or use of iterative reconstruction technique. COMPARISON:  September 30, 2021 FINDINGS: Brain: No evidence of acute infarction, hemorrhage, hydrocephalus, extra-axial collection or mass lesion/mass effect. Vascular: No hyperdense vessel is noted. Skull: Normal. Negative for fracture or focal lesion. Sinuses/Orbits: No acute finding. Other: None. IMPRESSION: No CT evidence of acute intracranial abnormality. Electronically Signed   By: Abelardo Diesel M.D.   On: 04/16/2022 08:50  ? ?CT Angio Chest PE W and/or Wo Contrast ? ?Result Date: 04/16/2022 ?CLINICAL DATA:  Positive edema hyper, pulmonary embolism suspected. Near syncopal episode.  Frequent dizzy spells. EXAM: CT ANGIOGRAPHY CHEST WITH CONTRAST TECHNIQUE: Multidetector CT imaging of the chest was performed using the standard protocol during bolus administration of intravenous contrast. Multiplanar CT image reconstructions and MIPs were obtained to evaluate the vascular anatomy. RADIATION DOSE REDUCTION: This exam was performed according to the departmental dose-optimization program which includes automated exposure control, adjustment of the mA and/or  kV according to patient size and/or use of iterative reconstruction technique. CONTRAST:  33m OMNIPAQUE IOHEXOL 350 MG/ML SOLN COMPARISON:  Portable chest today, PA Lat chest 09/30/2021, chest CT with contrast 09/30/2021, chest CT without contrast 10/21/2020. FINDINGS: Cardiovascular: The cardiac size is normal. The pulmonary arteries are normal in caliber without evidence thromboemboli. In the posteromedial right atrium just above the inferior cavoatrial junction there is a hypodense, ovoid slightly lobular mass measuring 3.8 x 2.5 by 2.7 cm and 59.1 Hounsfield units, not seen on prior contrast exam and could be a blood clot, neoplasm or tumor thrombus. Since this is immediately above the inferior cavoatrial junction, patency of the IVC cannot be established, but if the IVC is occluded then I do not see upper abdominal compensatory varices. The aorta and great vessels are within normal limits. There is no visible coronary artery calcification. The pulmonary veins are decompressed. Mediastinum/Nodes: No enlarged mediastinal, hilar, or axillary lymph nodes. The lower poles of the thyroid gland, the trachea and esophagus demonstrate no significant findings. Lungs/Pleura: There is increased linear atelectasis in the lung bases. Central airways and lungs are otherwise clear. There is no pleural effusion, thickening or pneumothorax. Upper Abdomen: No acute abnormality. Musculoskeletal: No chest wall mass or acute or suspicious osseous findings. Mild thoracic spondylosis. Review of the MIP images confirms the above findings. IMPRESSION: 1. 3.8 x 2.5 x 2.7 cm hypodense mass in the posteromedial right atrium just above the inferior cavoatrial junction, not seen on 09/30/2021, statistically most likely a blood clot but primary neoplasm or tumor thrombus are also possibilities. Echocardiography or MRI without and with contrast may be helpful for further evaluation. 2. Patency of the IVC can't be confirmed, but if the IVC is  occluded then there are no chest wall or other compensatory varices visible at this time. 3. No evidence of pulmonary arterial dilatation or embolic arterial filling defect. Electronically Signed   By: KTelford NabM.D.   On: 04/16/2022 07:18  ? ?UKoreaVenous Img Lower Bilateral (DVT) ? ?Result Date: 04/16/2022 ?CLINICAL DATA:  Positive D-dimers.  COVID positive. EXAM: Bilateral LOWER EXTREMITY VENOUS DOPPLER ULTRASOUND TECHNIQUE: Gray-scale sonography with compression, as well as color and duplex ultrasound, were performed to evaluate the deep venous system(s) from the level of the common femoral vein through the popliteal and proximal calf veins. COMPARISON:  None. FINDINGS: VENOUS Normal compressibility of the common femoral, superficial femoral, and popliteal veins, as well as the visualized calf veins. Visualized portions of profunda femoral vein and great saphenous vein unremarkable. No filling defects to suggest DVT on grayscale or color Doppler imaging. Doppler waveforms show normal direction of venous flow, normal respiratory plasticity and response to augmentation. Limited views of the contralateral common femoral vein are unremarkable. OTHER None. Limitations: none IMPRESSION: Negative. Electronically Signed   By: WAbelardo DieselM.D.   On: 04/16/2022 11:43  ? ?DG Chest Portable 1 View ? ?Result Date: 04/16/2022 ?CLINICAL DATA:  56year old male with history of recurrent near syncopal feeling. Dizziness. Cough. EXAM: PORTABLE CHEST 1 VIEW COMPARISON:  Chest x-ray 09/30/2021. FINDINGS: Lung volumes are normal. No consolidative airspace disease.  No pleural effusions. No pneumothorax. No pulmonary nodule or mass noted. Pulmonary vasculature and the cardiomediastinal silhouette are within normal limits. IMPRESSION: No radiographic evidence of acute cardiopulmonary disease. Electronically Signed   By: Vinnie Langton M.D.   On: 04/16/2022 05:33  ? ?ECHOCARDIOGRAM COMPLETE ? ?Result Date: 04/16/2022 ?    ECHOCARDIOGRAM REPORT   Patient Name:   Stephen Mejia Date of Exam: 04/16/2022 Medical Rec #:  [id number]     Height:       68.0 in Accession #:    [id number]    Weight:       181.0 lb Date of Birth:  1966-11-06

## 2022-04-17 NOTE — Progress Notes (Signed)
?  Transition of Care (TOC) Screening Note ? ? ?Patient Details  ?Name: Stephen Mejia ?Date of Birth: [birth year] ? ? ?Transition of Care (TOC) CM/SW Contact:    ?Alberteen Sam, LCSW ?Phone Number: ?04/17/2022, 2:09 PM ? ?DC today no needs ? ?Transition of Care Department [facility]) has reviewed patient and no TOC needs have been identified at this time. We will continue to monitor patient advancement through interdisciplinary progression rounds. If new patient transition needs arise, please place a TOC consult. ? Pricilla Riffle, [place] ?[phone] ? ?
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
## 2022-04-17 NOTE — Progress Notes (Signed)
Pt refusing 4/23 &4/24. Asked multiple times if he wanted MRI. Per Dr. [name] ok to cancel if pt is refusing. ?

## 2022-04-18 LAB — HIV ANTIBODY (ROUTINE TESTING W REFLEX): HIV Screen 4th Generation wRfx: NONREACTIVE

## 2022-04-24 ENCOUNTER — Other Ambulatory Visit: Payer: Self-pay | Admitting: Internal Medicine

## 2022-04-24 DIAGNOSIS — D151 Benign neoplasm of heart: Secondary | ICD-10-CM

## 2022-04-25 ENCOUNTER — Telehealth (HOSPITAL_COMMUNITY): Payer: Self-pay | Admitting: Emergency Medicine

## 2022-04-25 NOTE — Telephone Encounter (Signed)
Pt wishing to cancel CMR.  ? ?Claustrophobic  ? ?Marchia Bond RN Navigator Cardiac Imaging ?[facility] ?[phone] Office  ?[phone] Cell ? ? ?Pt r/s for 5/17 at 8:00am ?

## 2022-04-26 ENCOUNTER — Ambulatory Visit: Admission: RE | Admit: 2022-04-26 | Payer: BC Managed Care – PPO | Source: Ambulatory Visit

## 2022-05-09 ENCOUNTER — Telehealth (HOSPITAL_COMMUNITY): Payer: Self-pay | Admitting: Emergency Medicine

## 2022-05-09 NOTE — Telephone Encounter (Signed)
Reaching out to patient to offer assistance regarding upcoming cardiac imaging study; pt verbalizes understanding of appt date/time, parking situation and where to check in, pre-test NPO status and medications ordered, and verified current allergies; name and call back number provided for further questions should they arise ?Marchia Bond RN Navigator Cardiac Imaging ?[facility] ?[phone] office ?[phone] cell ? ?CLAUSTRO ?Arrival 730 ?Denies metal implants ?Denies iv issues ? ?

## 2022-05-10 ENCOUNTER — Ambulatory Visit
Admission: RE | Admit: 2022-05-10 | Discharge: 2022-05-10 | Disposition: A | Discharge status: Home / Self Care | Payer: BC Managed Care – PPO | Source: Ambulatory Visit | Attending: Internal Medicine | Admitting: Internal Medicine

## 2022-05-10 DIAGNOSIS — D151 Benign neoplasm of heart: Secondary | ICD-10-CM | POA: Insufficient documentation

## 2022-05-10 IMAGING — MR MR CARD MORPHOLOGY WO/W CM
53 of 54 series · 53 of 54 positions shown · IV contrast (10ml Gadavist)
Comparison: none

CLINICAL DATA: Possible right atrial mass noted on Chest CT.

EXAM:
CARDIAC MRI
TECHNIQUE: The patient was scanned on a 1.5T Siemens magnet. A dedicated
cardiac coil was used. Functional imaging was done using Fiesta
sequences. [DATE], and 4 chamber views were done to assess for RWMA's.
Modified DEJEAN rule using a short axis stack was used to
calculate an ejection fraction on a dedicated work station using
Circle software. The patient received 10 cc of Gadavist. After 10
minutes inversion recovery sequences were used to assess for
infiltration and scar tissue.
CONTRAST:  10 cc  of Gadavist

[Series 4: t2_haste_db_tra_bh · axial · 8.0mm · 1.41mm/px · 1 of 16 slices shown]
[im 1/16]
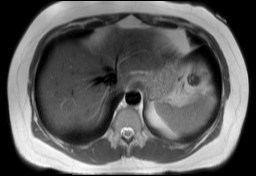

[Series 5: stir_haste_db_tra_bh · axial · 8.0mm · 1.41mm/px · 1 of 16 slices shown]
[im 1/16]
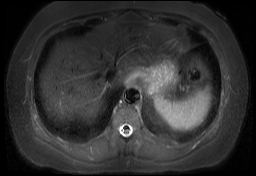

[Series 6: bSSFP · sagittal · 6.0mm · 1.41mm/px · 1 of 30 slices shown (1 of 18)]
[im 1/30]
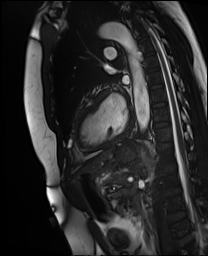

[Series 7: bSSFP · axial · 6.0mm · 1.41mm/px · 1 of 30 slices shown (2 of 18)]
[im 1/30]
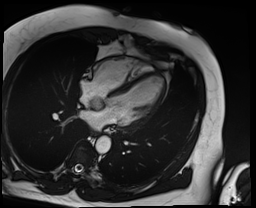

[Series 8: bSSFP · oblique · 8.0mm · 1.61mm/px · 1 of 30 slices shown (3 of 18)]
[im 1/30]
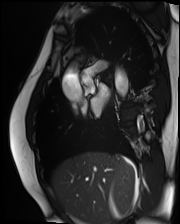

[Series 9: bSSFP · oblique · 8.0mm · 1.61mm/px · 1 of 30 slices shown (4 of 18)]
[im 1/30]
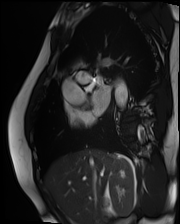

[Series 10: bSSFP · oblique · 8.0mm · 1.61mm/px · 1 of 30 slices shown (5 of 18)]
[im 1/30]
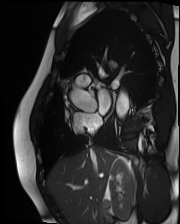

[Series 11: bSSFP · oblique · 8.0mm · 1.61mm/px · 1 of 30 slices shown (6 of 18)]
[im 1/30]
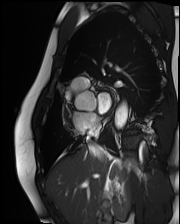

[Series 12: bSSFP · oblique · 8.0mm · 1.61mm/px · 1 of 30 slices shown (7 of 18)]
[im 1/30]
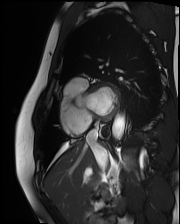

[Series 13: bSSFP · oblique · 8.0mm · 1.61mm/px · 1 of 30 slices shown (8 of 18)]
[im 1/30]
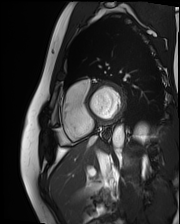

[Series 14: bSSFP · oblique · 8.0mm · 1.61mm/px · 1 of 30 slices shown (9 of 18)]
[im 1/30]
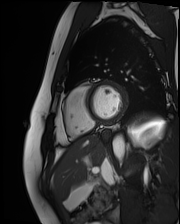

[Series 15: bSSFP · oblique · 8.0mm · 1.61mm/px · 1 of 30 slices shown (10 of 18)]
[im 1/30]
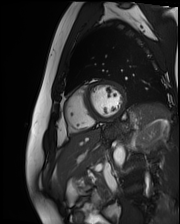

[Series 16: bSSFP · oblique · 8.0mm · 1.61mm/px · 1 of 30 slices shown (11 of 18)]
[im 1/30]
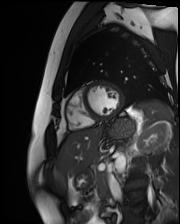

[Series 17: bSSFP · oblique · 8.0mm · 1.61mm/px · 1 of 30 slices shown (12 of 18)]
[im 1/30]
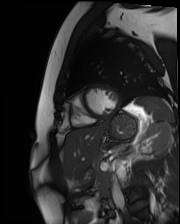

[Series 18: bSSFP · oblique · 8.0mm · 1.61mm/px · 1 of 30 slices shown (13 of 18)]
[im 1/30]
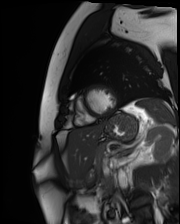

[Series 19: bSSFP · oblique · 8.0mm · 1.61mm/px · 1 of 30 slices shown (14 of 18)]
[im 1/30]
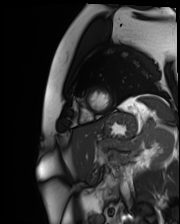

[Series 20: bSSFP · oblique · 8.0mm · 1.61mm/px · 1 of 30 slices shown (15 of 18)]
[im 1/30]
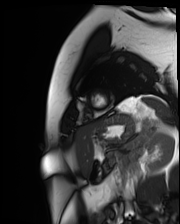

[Series 21: bSSFP · oblique · 8.0mm · 1.61mm/px · 1 of 30 slices shown (16 of 18)]
[im 1/30]
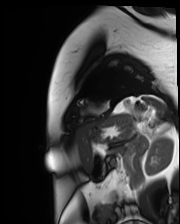

[Series 22: bSSFP · oblique · 8.0mm · 1.61mm/px · 1 of 30 slices shown (17 of 18)]
[im 1/30]
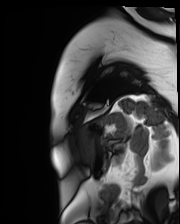

[Series 23: bSSFP · axial · 6.0mm · 1.41mm/px · 1 of 30 slices shown (18 of 18)]
[im 1/30]
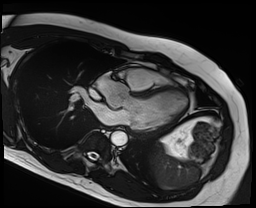

[Series 24: (id)_long_t1 · oblique · 8.0mm · 1.56mm/px · 1 of 40 slices shown]
[im 1/40]
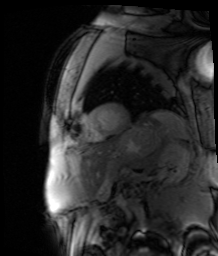

[Series 25: (id)_long_t1_moco · oblique · 8.0mm · 1.56mm/px · 1 of 40 slices shown]
[im 1/40]
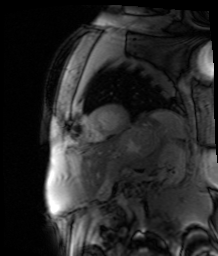

[Series 26: (id)_long_t1_moco_t1 · oblique · 8.0mm · 1.56mm/px · 1 of 5 slices shown (1 of 2)]
[im 1/5]
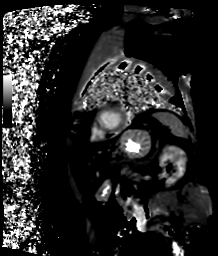

[Series 26: (id)_long_t1_moco_t1 · 8.0mm · 0.66mm/px · 1 of 4 slices shown (2 of 2)]
[im 1/4  full-range]
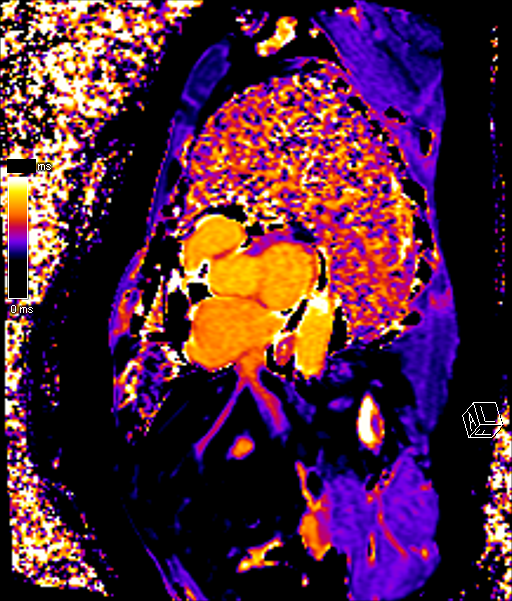

[Series 28: (id)_trufi · oblique · 8.0mm · 2.08mm/px · 1 of 15 slices shown]
[im 1/15]
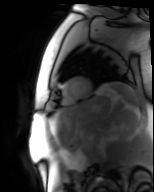

[Series 29: (id)_trufi_moco · oblique · 8.0mm · 2.08mm/px · 1 of 15 slices shown]
[im 1/15]
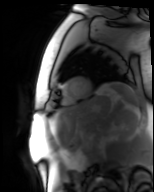

[Series 30: (id)_trufi_moco_t2 · oblique · 8.0mm · 2.08mm/px · 1 of 5 slices shown]
[im 1/5]
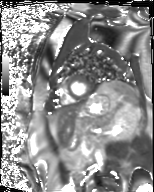

[Series 32: ax t1_tse_db · axial · 5.0mm · 1.32mm/px · 1 of 3 slices shown]
[im 1/3]
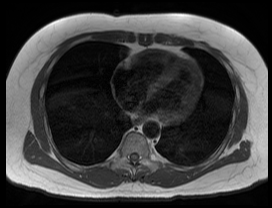

[Series 33: ax fatsat t1_tse_db · axial · 5.0mm · 1.32mm/px · 1 of 3 slices shown (1 of 2)]
[im 1/3]
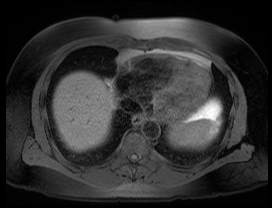

[Series 34: sag t1_tse_db · sagittal · 7.0mm · 1.32mm/px · 1 of 3 slices shown]
[im 1/3]
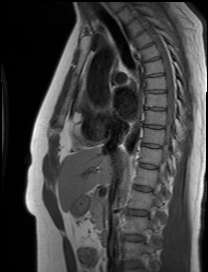

[Series 35: sag fatsat t1_tse_db · sagittal · 7.0mm · 1.32mm/px · 1 of 3 slices shown (1 of 2)]
[im 1/3]
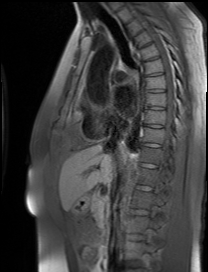

[Series 36: STIR · axial · 5.0mm · 1.92mm/px · 1 of 3 slices shown]
[im 1/3]
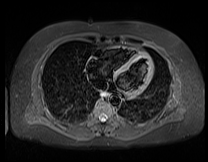

[Series 37: pre short axis · oblique · non-contrast · 8.0mm · 2.25mm/px · 1 of 10 slices shown (1 of 6)]
[im 1/10]
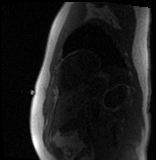

[Series 38: pre short axis · oblique · non-contrast · 8.0mm · 2.25mm/px · 1 of 10 slices shown (2 of 6)]
[im 1/10]
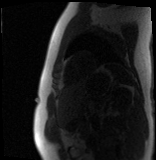

[Series 39: pre short axis · oblique · non-contrast · 8.0mm · 2.25mm/px · 1 of 10 slices shown (3 of 6)]
[im 1/10]
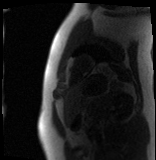

[Series 40: pre short axis · oblique · non-contrast · 8.0mm · 2.25mm/px · 1 of 10 slices shown (4 of 6)]
[im 1/10]
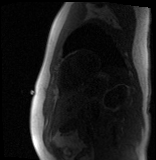

[Series 41: pre short axis · oblique · non-contrast · 8.0mm · 2.25mm/px · 1 of 10 slices shown (5 of 6)]
[im 1/10]
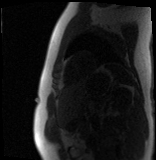

[Series 42: pre short axis · oblique · non-contrast · 8.0mm · 2.25mm/px · 1 of 10 slices shown (6 of 6)]
[im 1/10]
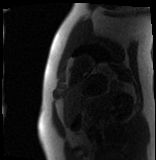

[Series 43: rest short axis · oblique · 8.0mm · 2.25mm/px · 1 of 60 slices shown (1 of 6)]
[im 1/60]
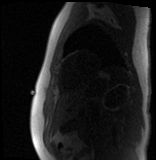

[Series 44: rest short axis · oblique · 8.0mm · 2.25mm/px · 1 of 60 slices shown (2 of 6)]
[im 1/60]
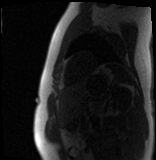

[Series 45: rest short axis · oblique · 8.0mm · 2.25mm/px · 1 of 60 slices shown (3 of 6)]
[im 1/60]
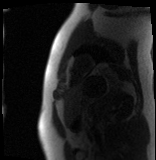

[Series 46: rest short axis · oblique · 8.0mm · 2.25mm/px · 1 of 60 slices shown (4 of 6)]
[im 1/60]
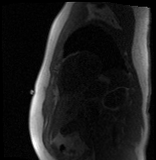

[Series 47: rest short axis · oblique · 8.0mm · 2.25mm/px · 1 of 60 slices shown (5 of 6)]
[im 1/60]
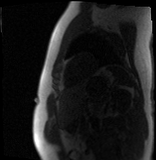

[Series 48: rest short axis · oblique · 8.0mm · 2.25mm/px · 1 of 60 slices shown (6 of 6)]
[im 1/60]
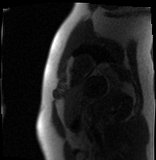

[Series 49: ax t1_tse_db post · axial · 5.0mm · 1.32mm/px · 1 of 3 slices shown]
[im 1/3]
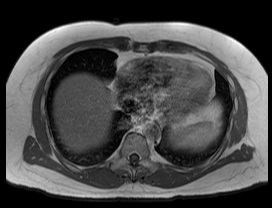

[Series 50: ax fatsat t1_tse_db · axial · 5.0mm · 1.32mm/px · 1 of 3 slices shown (2 of 2)]
[im 1/3]
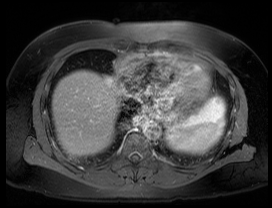

[Series 51: sag t1_tse_db post · sagittal · 7.0mm · 1.32mm/px · 1 of 3 slices shown]
[im 1/3]
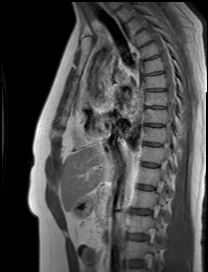

[Series 52: sag fatsat t1_tse_db · sagittal · 7.0mm · 1.32mm/px · 1 of 3 slices shown (2 of 2)]
[im 1/3]
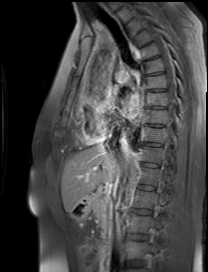

[Series 54: lge_single shot sa · oblique · 8.0mm · 2.08mm/px · 1 of 15 slices shown (1 of 2)]
[im 1/15]
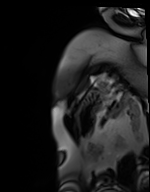

[Series 55: lge_single shot sa · oblique · 8.0mm · 2.08mm/px · 1 of 15 slices shown (2 of 2)]
[im 1/15]
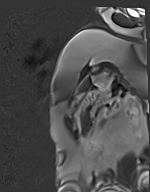

[Series 56: lge_single shot 4 · axial · 6.0mm · 1.98mm/px · 1 of 1 slices shown (1 of 3)]
[im 1/1]
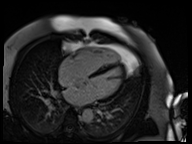

[Series 57: lge_single shot 4 · axial · 6.0mm · 1.98mm/px · 1 of 1 slices shown (2 of 3)]
[im 1/1]
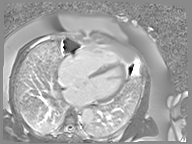

[Series 60: lge_single shot 4 · axial · 6.0mm · 1.98mm/px · 1 of 1 slices shown (3 of 3)]
[im 1/1]
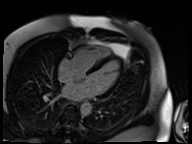

[53 of 54 positions shown; findings below may reference images not displayed]

FINDINGS: 1. Normal left ventricular size, thickness and systolic function
(LVEF = 57%). There are no regional wall motion abnormalities.

There is no late gadolinium enhancement in the left ventricular
myocardium.

LVEDV: 143 ml

LVESV: 62

SV: 81 ml

CO: 5.3L/min

Myocardial mass: 88g

2. Normal right ventricular size, thickness and systolic function
(RVEF = 55%). There are no regional wall motion abnormalities.

3.  Normal left and right atrial size.  No right atrial mass noted.

4. Normal size of the aortic root, ascending aorta and pulmonary
artery.

5.  No significant valvular abnormalities.

6.  Normal pericardium.  No pericardial effusion.
IMPRESSION: 1.  Normal LV and RV size and function.  LVEF = 57%

2.  No evidence of cardiac mass in the right atrium.

3.  No LGE or scar noted.

4.  No significant valvular abnormalities noted.

5.  Normal cardiac mri with no evidence of cardiac mass.

## 2022-05-10 MED ORDER — GADOBUTROL 1 MMOL/ML IV SOLN
10.0000 mL | Freq: Once | INTRAVENOUS | Status: AC | PRN
  Administered 2022-05-10: 10 mL via INTRAVENOUS

## 2022-06-26 ENCOUNTER — Ambulatory Visit: Payer: Self-pay | Admitting: Behavioral Health

## 2022-07-17 ENCOUNTER — Ambulatory Visit (INDEPENDENT_AMBULATORY_CARE_PROVIDER_SITE_OTHER): Payer: BC Managed Care – PPO | Admitting: Behavioral Health

## 2022-07-17 ENCOUNTER — Encounter: Payer: Self-pay | Admitting: Behavioral Health

## 2022-07-17 VITALS — BP 125/80 | HR 69 | Ht 68.0 in | Wt 180.0 lb

## 2022-07-17 DIAGNOSIS — F410 Panic disorder [episodic paroxysmal anxiety] without agoraphobia: Secondary | ICD-10-CM | POA: Diagnosis not present

## 2022-07-17 DIAGNOSIS — F411 Generalized anxiety disorder: Secondary | ICD-10-CM | POA: Diagnosis not present
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
## 2022-08-14 ENCOUNTER — Ambulatory Visit: Payer: BC Managed Care – PPO | Admitting: Behavioral Health

## 2022-08-14 NOTE — Progress Notes (Unsigned)
                Denzil Mceachron L Timon Geissinger, LMFT 

## 2022-09-05 ENCOUNTER — Ambulatory Visit (INDEPENDENT_AMBULATORY_CARE_PROVIDER_SITE_OTHER): Payer: BC Managed Care – PPO | Admitting: Behavioral Health

## 2022-09-05 DIAGNOSIS — F410 Panic disorder [episodic paroxysmal anxiety] without agoraphobia: Secondary | ICD-10-CM | POA: Diagnosis not present

## 2022-09-05 DIAGNOSIS — F411 Generalized anxiety disorder: Secondary | ICD-10-CM

## 2022-09-05 NOTE — Progress Notes (Signed)
                Ziyon Cedotal L Shavonda Wiedman, LMFT 
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
## 2022-09-26 ENCOUNTER — Ambulatory Visit (INDEPENDENT_AMBULATORY_CARE_PROVIDER_SITE_OTHER): Payer: BC Managed Care – PPO | Admitting: Behavioral Health

## 2022-09-26 DIAGNOSIS — F410 Panic disorder [episodic paroxysmal anxiety] without agoraphobia: Secondary | ICD-10-CM | POA: Diagnosis not present

## 2022-09-26 DIAGNOSIS — F411 Generalized anxiety disorder: Secondary | ICD-10-CM | POA: Diagnosis not present

## 2022-09-26 NOTE — Progress Notes (Signed)
                Stephen Mejia L Alma Mohiuddin, LMFT 
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
## 2022-10-31 ENCOUNTER — Ambulatory Visit (INDEPENDENT_AMBULATORY_CARE_PROVIDER_SITE_OTHER): Payer: BC Managed Care – PPO | Admitting: Behavioral Health

## 2022-10-31 DIAGNOSIS — F410 Panic disorder [episodic paroxysmal anxiety] without agoraphobia: Secondary | ICD-10-CM

## 2022-10-31 DIAGNOSIS — F411 Generalized anxiety disorder: Secondary | ICD-10-CM | POA: Diagnosis not present
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
## 2022-10-31 NOTE — Progress Notes (Signed)
                Jermaine Tholl L Anastashia Westerfeld, LMFT 

## 2022-11-21 ENCOUNTER — Ambulatory Visit (INDEPENDENT_AMBULATORY_CARE_PROVIDER_SITE_OTHER): Payer: BC Managed Care – PPO | Admitting: Behavioral Health

## 2022-11-21 DIAGNOSIS — F411 Generalized anxiety disorder: Secondary | ICD-10-CM

## 2022-11-21 DIAGNOSIS — F410 Panic disorder [episodic paroxysmal anxiety] without agoraphobia: Secondary | ICD-10-CM

## 2022-11-21 NOTE — Progress Notes (Signed)
                Stephen Mejia L Jettson Crable, LMFT 
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
## 2022-12-28 ENCOUNTER — Ambulatory Visit (AMBULATORY_SURGERY_CENTER): Payer: BC Managed Care – PPO | Admitting: *Deleted

## 2022-12-28 VITALS — Ht 67.0 in | Wt 180.0 lb

## 2022-12-28 DIAGNOSIS — Z1211 Encounter for screening for malignant neoplasm of colon: Secondary | ICD-10-CM

## 2022-12-28 MED ORDER — NA SULFATE-K SULFATE-MG SULF 17.5-3.13-1.6 GM/177ML PO SOLN: 1.0000 | Freq: Once | ORAL | 0 refills | Status: AC

## 2022-12-28 NOTE — Progress Notes (Signed)

## 2023-01-08 ENCOUNTER — Encounter: Payer: BC Managed Care – PPO | Admitting: Gastroenterology

## 2023-01-18 ENCOUNTER — Encounter: Payer: Self-pay | Admitting: Gastroenterology

## 2023-01-18 ENCOUNTER — Telehealth: Payer: Self-pay | Admitting: Gastroenterology

## 2023-01-18 DIAGNOSIS — Z1211 Encounter for screening for malignant neoplasm of colon: Secondary | ICD-10-CM

## 2023-01-18 MED ORDER — NA SULFATE-K SULFATE-MG SULF 17.5-3.13-1.6 GM/177ML PO SOLN: 1.0000 | Freq: Once | ORAL | 0 refills | Status: AC

## 2023-01-18 NOTE — Telephone Encounter (Signed)
Patient is calling to have Suprep sent to [facility] on [address]. Please advise

## 2023-01-18 NOTE — Telephone Encounter (Signed)
Suprep sent to [facility].  Pt notified.

## 2023-01-25 ENCOUNTER — Ambulatory Visit (AMBULATORY_SURGERY_CENTER): Payer: BC Managed Care – PPO | Admitting: Gastroenterology

## 2023-01-25 ENCOUNTER — Encounter: Payer: Self-pay | Admitting: Gastroenterology

## 2023-01-25 VITALS — BP 102/69 | HR 71 | Temp 96.2°F | Resp 14 | Ht 68.0 in | Wt 180.0 lb

## 2023-01-25 DIAGNOSIS — K5730 Diverticulosis of large intestine without perforation or abscess without bleeding: Secondary | ICD-10-CM

## 2023-01-25 DIAGNOSIS — Z1211 Encounter for screening for malignant neoplasm of colon: Secondary | ICD-10-CM

## 2023-01-25 DIAGNOSIS — K641 Second degree hemorrhoids: Secondary | ICD-10-CM

## 2023-01-25 MED ORDER — SODIUM CHLORIDE 0.9 % IV SOLN: 500.0000 mL | INTRAVENOUS | Status: DC

## 2023-01-25 NOTE — Patient Instructions (Signed)
Please read handouts provided. Continue present medications. Repeat colonoscopy in 10 years for screening. Return to GI clinic as needed.   YOU HAD AN ENDOSCOPIC PROCEDURE TODAY AT [facility]:   Refer to the procedure report that was given to you for any specific questions about what was found during the examination.  If the procedure report does not answer your questions, please call your gastroenterologist to clarify.  If you requested that your care partner not be given the details of your procedure findings, then the procedure report has been included in a sealed envelope for you to review at your convenience later.  YOU SHOULD EXPECT: Some feelings of bloating in the abdomen. Passage of more gas than usual.  Walking can help get rid of the air that was put into your GI tract during the procedure and reduce the bloating. If you had a lower endoscopy (such as a colonoscopy or flexible sigmoidoscopy) you may notice spotting of blood in your stool or on the toilet paper. If you underwent a bowel prep for your procedure, you may not have a normal bowel movement for a few days.  Please Note:  You might notice some irritation and congestion in your nose or some drainage.  This is from the oxygen used during your procedure.  There is no need for concern and it should clear up in a day or so.  SYMPTOMS TO REPORT IMMEDIATELY:  Following lower endoscopy (colonoscopy or flexible sigmoidoscopy):  Excessive amounts of blood in the stool  Significant tenderness or worsening of abdominal pains  Swelling of the abdomen that is new, acute  Fever of 100F or higher  For urgent or emergent issues, a gastroenterologist can be reached at any hour by calling [phone]. Do not use MyChart messaging for urgent concerns.    DIET:  We do recommend a small meal at first, but then you may proceed to your regular diet.  Drink plenty of fluids but you should avoid alcoholic beverages for 24  hours.  ACTIVITY:  You should plan to take it easy for the rest of today and you should NOT DRIVE or use heavy machinery until tomorrow (because of the sedation medicines used during the test).    FOLLOW UP: Our staff will call the number listed on your records the next business day following your procedure.  We will call around 7:15- 8:00 am to check on you and address any questions or concerns that you may have regarding the information given to you following your procedure. If we do not reach you, we will leave a message.     If any biopsies were taken you will be contacted by phone or by letter within the next 1-3 weeks.  Please call us at [phone] if you have not heard about the biopsies in 3 weeks.    SIGNATURES/CONFIDENTIALITY: You and/or your care partner have signed paperwork which will be entered into your electronic medical record.  These signatures attest to the fact that that the information above on your After Visit Summary has been reviewed and is understood.  Full responsibility of the confidentiality of this discharge information lies with you and/or your care-partner.

## 2023-01-25 NOTE — Op Note (Signed)
[facility] Patient Name: Stephen Mejia Procedure Date: 01/25/2023 1:30 PM MRN: [id number] Endoscopist: Gerrit Heck , MD, [id number] Age: 57 Referring MD:  Date of Birth: Aug 01, 1966 Gender: Male Account #: [id number] Procedure:                Colonoscopy Indications:              Screening for colorectal malignant neoplasm, This                            is the patient's first colonoscopy Medicines:                Monitored Anesthesia Care Procedure:                Pre-Anesthesia Assessment:                           - Prior to the procedure, a History and Physical                            was performed, and patient medications and                            allergies were reviewed. The patient's tolerance of                            previous anesthesia was also reviewed. The risks                            and benefits of the procedure and the sedation                            options and risks were discussed with the patient.                            All questions were answered, and informed consent                            was obtained. Prior Anticoagulants: The patient has                            taken no anticoagulant or antiplatelet agents. ASA                            Grade Assessment: II - A patient with mild systemic                            disease. After reviewing the risks and benefits,                            the patient was deemed in satisfactory condition to                            undergo the procedure.  After obtaining informed consent, the colonoscope                            was passed under direct vision. Throughout the                            procedure, the patient's blood pressure, pulse, and                            oxygen saturations were monitored continuously. The                            CF [id number] #[id number] was introduced through the anus                            and advanced to the the  terminal ileum. The                            colonoscopy was performed without difficulty. The                            patient tolerated the procedure well. The quality                            of the bowel preparation was excellent. The                            terminal ileum, ileocecal valve, appendiceal                            orifice, and rectum were photographed. Scope In: 1:36:25 PM Scope Out: 1:48:45 PM Scope Withdrawal Time: 0 hours 8 minutes 42 seconds  Total Procedure Duration: 0 hours 12 minutes 20 seconds  Findings:                 The perianal and digital rectal examinations were                            normal.                           A few small-mouthed diverticula were found in the                            sigmoid colon.                           Non-bleeding internal hemorrhoids were found during                            retroflexion. The hemorrhoids were medium-sized.                           The exam was otherwise normal throughout the  remainder of the colon.                           The terminal ileum appeared normal. Complications:            No immediate complications. Estimated Blood Loss:     Estimated blood loss: none. Impression:               - Diverticulosis in the sigmoid colon.                           - Non-bleeding internal hemorrhoids.                           - The examined portion of the ileum was normal.                           - No specimens collected. Recommendation:           - Patient has a contact number available for                            emergencies. The signs and symptoms of potential                            delayed complications were discussed with the                            patient. Return to normal activities tomorrow.                            Written discharge instructions were provided to the                            patient.                           - Resume previous  diet.                           - Continue present medications.                           - Repeat colonoscopy in 10 years for screening                            purposes.                           - Return to GI office PRN.                           - Internal hemorrhoids were noted on this study and                            may be amenable to hemorrhoid band ligation. If you  are interested in further treatment of these                            hemorrhoids with band ligation, please contact my                            clinic to set up an appointment for evaluation and                            treatment. Gerrit Heck, MD 01/25/2023 1:55:02 PM

## 2023-01-25 NOTE — Progress Notes (Signed)
GASTROENTEROLOGY PROCEDURE H&P NOTE   Primary Care Physician: Nolene Ebbs, MD    Reason for Procedure:  Colon Cancer screening  Plan:    Colonoscopy  Patient is appropriate for endoscopic procedure(s) in the ambulatory ([place]) setting.  The nature of the procedure, as well as the risks, benefits, and alternatives were carefully and thoroughly reviewed with the patient. Ample time for discussion and questions allowed. The patient understood, was satisfied, and agreed to proceed.     HPI: Stephen Mejia is a 57 y.o. male who presents for colonoscopy for routine Colon Cancer screening.  No active GI symptoms.  No known family history of colon cancer or related malignancy.  Patient is otherwise without complaints or active issues today.  Past Medical History:  Diagnosis Date   Allergy    seasonal   Anxiety    Enlarged prostate    GERD (gastroesophageal reflux disease)    "sometimes"   Migraine     Past Surgical History:  Procedure Laterality Date   no past surgery     updated 12/28/22    Prior to Admission medications   Medication Sig Start Date End Date Taking? Authorizing Provider  cetirizine (ZYRTEC) 10 MG tablet SMARTSIG:1 Tablet(s) By Mouth Every Evening PRN Patient not taking: Reported on 01/25/2023    [provider]  cholecalciferol (VITAMIN D3) 25 MCG (1000 UNIT) tablet Take 1,000 Units by mouth as needed.    [provider]  Multiple Vitamin (MULTIVITAMIN ADULT) TABS Take 1 tablet by mouth as needed.    [provider]  naproxen (EC NAPROSYN) 500 MG EC tablet Take 500 mg by mouth 2 (two) times daily as needed for pain.    [provider]  ondansetron (ZOFRAN-ODT) 4 MG disintegrating tablet Take 1 tablet (4 mg total) by mouth every 8 (eight) hours as needed for nausea or vomiting. 04/16/22   Alfred Levins, [place], MD  sertraline (ZOLOFT) 25 MG tablet SMARTSIG:1 Tablet(s) By Mouth Every Evening Patient not taking: Reported on 12/28/2022  11/30/22   [provider]  tiZANidine (ZANAFLEX) 4 MG tablet Take 4 mg by mouth at bedtime as needed for muscle spasms.    [provider]  vitamin C (ASCORBIC ACID) 500 MG tablet Take 500 mg by mouth as needed.    [provider]  zinc sulfate 220 (50 Zn) MG capsule Take 220 mg by mouth as needed.    [provider]    Current Outpatient Medications  Medication Sig Dispense Refill   cetirizine (ZYRTEC) 10 MG tablet SMARTSIG:1 Tablet(s) By Mouth Every Evening PRN (Patient not taking: Reported on 01/25/2023)     cholecalciferol (VITAMIN D3) 25 MCG (1000 UNIT) tablet Take 1,000 Units by mouth as needed.     Multiple Vitamin (MULTIVITAMIN ADULT) TABS Take 1 tablet by mouth as needed.     naproxen (EC NAPROSYN) 500 MG EC tablet Take 500 mg by mouth 2 (two) times daily as needed for pain.     ondansetron (ZOFRAN-ODT) 4 MG disintegrating tablet Take 1 tablet (4 mg total) by mouth every 8 (eight) hours as needed for nausea or vomiting. 20 tablet 0   sertraline (ZOLOFT) 25 MG tablet SMARTSIG:1 Tablet(s) By Mouth Every Evening (Patient not taking: Reported on 12/28/2022)     tiZANidine (ZANAFLEX) 4 MG tablet Take 4 mg by mouth at bedtime as needed for muscle spasms.     vitamin C (ASCORBIC ACID) 500 MG tablet Take 500 mg by mouth as needed.  zinc sulfate 220 (50 Zn) MG capsule Take 220 mg by mouth as needed.     Current Facility-Administered Medications  Medication Dose Route Frequency Provider Last Rate Last Admin   0.9 %  sodium chloride infusion  500 mL Intravenous Continuous Biviana Saddler V, DO        Allergies as of 01/25/2023 - Review Complete 01/25/2023  Allergen Reaction Noted   Tramadol  03/28/2018    Family History  Problem Relation Age of Onset   Heart failure Mother    Diabetes Mother    Hypertension Mother    Colon cancer Neg Hx    Colon polyps Neg Hx    Crohn's disease Neg Hx    Esophageal cancer Neg Hx    Rectal cancer Neg Hx     Stomach cancer Neg Hx    Ulcerative colitis Neg Hx     Social History   Socioeconomic History   Marital status: Married    Spouse name: Not on file   Number of children: Not on file   Years of education: Not on file   Highest education level: Not on file  Occupational History   Not on file  Tobacco Use   Smoking status: Never   Smokeless tobacco: Never  Vaping Use   Vaping Use: Never used  Substance and Sexual Activity   Alcohol use: Never   Drug use: Never   Sexual activity: Not on file  Other Topics Concern   Not on file  Social History Narrative   Not on file   Social Determinants of Health   Financial Resource Strain: Not on file  Food Insecurity: Not on file  Transportation Needs: Not on file  Physical Activity: Not on file  Stress: Not on file  Social Connections: Not on file  Intimate Partner Violence: Not on file    Physical Exam: Vital signs in last 24 hours: '@BP'$  112/69   Pulse 86   Temp (!) 96.2 F (35.7 C) (Temporal)   Ht '5\' 8"'$  (1.727 m)   Wt 180 lb (81.6 kg)   SpO2 99%   BMI 27.37 kg/m  GEN: NAD EYE: Sclerae anicteric ENT: MMM CV: Non-tachycardic Pulm: CTA b/l GI: Soft, NT/ND NEURO:  Alert & Oriented x 3   Gerrit Heck, DO [facility]   01/25/2023 1:23 PM

## 2023-01-25 NOTE — Progress Notes (Signed)
Pt resting comfortably. VSS. Airway intact. SBAR complete to RN. All questions answered.   

## 2023-01-25 NOTE — Progress Notes (Signed)
Pt's states no medical or surgical changes since previsit or office visit. 

## 2023-01-26 ENCOUNTER — Telehealth: Payer: Self-pay | Admitting: *Deleted

## 2023-01-26 NOTE — Telephone Encounter (Signed)
  Follow up Call-    Row Labels 01/25/2023    1:02 PM  Call back number   Section Header. No data exists in this row.   Post procedure Call Back phone  #   [phone]  Permission to leave phone message   Yes     Patient questions:  Do you have a fever, pain , or abdominal swelling? No. Pain Score  0 *  Have you tolerated food without any problems? Yes.    Have you been able to return to your normal activities? Yes.    Do you have any questions about your discharge instructions: Diet   No. Medications  No. Follow up visit  No.  Do you have questions or concerns about your Care? No.  Actions: * If pain score is 4 or above: No action needed, pain <4.

## 2023-02-13 ENCOUNTER — Ambulatory Visit (INDEPENDENT_AMBULATORY_CARE_PROVIDER_SITE_OTHER): Payer: BC Managed Care – PPO | Admitting: Behavioral Health

## 2023-02-13 DIAGNOSIS — F410 Panic disorder [episodic paroxysmal anxiety] without agoraphobia: Secondary | ICD-10-CM

## 2023-02-13 DIAGNOSIS — F411 Generalized anxiety disorder: Secondary | ICD-10-CM | POA: Diagnosis not present

## 2023-02-13 NOTE — Progress Notes (Signed)
                Liora Myles L Bianco Cange, LMFT 
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
## 2023-03-06 ENCOUNTER — Ambulatory Visit (INDEPENDENT_AMBULATORY_CARE_PROVIDER_SITE_OTHER): Payer: BC Managed Care – PPO | Admitting: Behavioral Health

## 2023-03-06 ENCOUNTER — Other Ambulatory Visit: Payer: Self-pay | Admitting: Internal Medicine

## 2023-03-06 DIAGNOSIS — F411 Generalized anxiety disorder: Secondary | ICD-10-CM | POA: Diagnosis not present

## 2023-03-06 NOTE — Progress Notes (Signed)
                Simmie Camerer L Oluwatobi Ruppe, LMFT 
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
## 2023-03-07 LAB — URINE CULTURE
MICRO NUMBER:: 14681470
Result:: NO GROWTH
SPECIMEN QUALITY:: ADEQUATE

## 2023-04-09 ENCOUNTER — Ambulatory Visit (INDEPENDENT_AMBULATORY_CARE_PROVIDER_SITE_OTHER): Payer: BC Managed Care – PPO | Admitting: Behavioral Health

## 2023-04-09 DIAGNOSIS — F410 Panic disorder [episodic paroxysmal anxiety] without agoraphobia: Secondary | ICD-10-CM | POA: Diagnosis not present

## 2023-04-09 DIAGNOSIS — Z658 Other specified problems related to psychosocial circumstances: Secondary | ICD-10-CM

## 2023-04-09 DIAGNOSIS — F411 Generalized anxiety disorder: Secondary | ICD-10-CM

## 2023-04-09 NOTE — Progress Notes (Signed)
                Stephen Mejia L Rabia Argote, LMFT 
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
## 2023-05-01 ENCOUNTER — Ambulatory Visit: Payer: BC Managed Care – PPO | Admitting: Behavioral Health

## 2023-05-01 NOTE — Progress Notes (Unsigned)
                Stephen Mejia Ricardo Schubach, LMFT 

## 2023-05-23 ENCOUNTER — Ambulatory Visit (INDEPENDENT_AMBULATORY_CARE_PROVIDER_SITE_OTHER): Payer: BC Managed Care – PPO | Admitting: Behavioral Health

## 2023-05-23 DIAGNOSIS — Z658 Other specified problems related to psychosocial circumstances: Secondary | ICD-10-CM

## 2023-05-23 DIAGNOSIS — F411 Generalized anxiety disorder: Secondary | ICD-10-CM

## 2023-05-23 DIAGNOSIS — F410 Panic disorder [episodic paroxysmal anxiety] without agoraphobia: Secondary | ICD-10-CM | POA: Diagnosis not present
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
## 2023-05-23 NOTE — Progress Notes (Signed)
                Stephen Mejia [name], LMFT 

## 2024-06-27 ENCOUNTER — Other Ambulatory Visit: Payer: Self-pay

## 2024-06-27 ENCOUNTER — Emergency Department: Payer: Worker's Compensation

## 2024-06-27 ENCOUNTER — Encounter: Payer: Self-pay | Admitting: Emergency Medicine

## 2024-06-27 ENCOUNTER — Emergency Department
Admission: EM | Admit: 2024-06-27 | Discharge: 2024-06-27 | Disposition: A | Discharge status: Home / Self Care | Payer: Worker's Compensation | Attending: Emergency Medicine | Admitting: Emergency Medicine

## 2024-06-27 DIAGNOSIS — W230XXA Caught, crushed, jammed, or pinched between moving objects, initial encounter: Secondary | ICD-10-CM | POA: Diagnosis not present

## 2024-06-27 DIAGNOSIS — Y990 Civilian activity done for income or pay: Secondary | ICD-10-CM | POA: Diagnosis not present

## 2024-06-27 DIAGNOSIS — S9031XA Contusion of right foot, initial encounter: Secondary | ICD-10-CM | POA: Diagnosis not present

## 2024-06-27 DIAGNOSIS — S99921A Unspecified injury of right foot, initial encounter: Secondary | ICD-10-CM | POA: Diagnosis present

## 2024-06-27 MED ORDER — IBUPROFEN 600 MG PO TABS
600.0000 mg | ORAL_TABLET | Freq: Once | ORAL | Status: AC
  Administered 2024-06-27: 600 mg via ORAL
  Filled 2024-06-27: qty 1

## 2024-06-27 MED ORDER — ACETAMINOPHEN 500 MG PO TABS
1000.0000 mg | ORAL_TABLET | Freq: Once | ORAL | Status: AC
  Administered 2024-06-27: 1000 mg via ORAL
  Filled 2024-06-27: qty 2

## 2024-06-27 NOTE — Discharge Instructions (Addendum)
 As we discussed, you have no broken bones on your x-rays but you likely suffered from a contusion that will need some time to heal.  Please read through the included information about RICE therapy (rest, ice, compression, and elevation).  We recommend that you take ibuprofen  600 mg 3 times a day with meals, as well as 1000 mg of acetaminophen  (Tylenol ) no more often than every 6 hours.  You may bear weight as tolerated and use the crutches for assistance as needed.  You will need to follow-up with your regular doctor or with Dr. Joya or another podiatrist if you still feel unable to use your foot normally by the time you need to return to work.

## 2024-06-27 NOTE — ED Notes (Signed)
 Pt to ED for a wc related injury with Monique Linsey, supervisor from [employer] in [place], [place].Supervisor stated no drug screen is needed in this case. Ineligibility form filled out. Yellow copy was given to supervisor and white copy was placed in the medical records bin.

## 2024-06-27 NOTE — ED Provider Notes (Signed)
 [facility] Provider Note    Event Date/Time   First MD Initiated Contact with Patient 06/27/24 0230     (approximate)   History   Foot Pain   HPI Stephen Mejia is a 58 y.o. male who presents for evaluation of injury to his right foot.  He was at work and said that his foot got pushed between pallets of equipment and was crushed.  No laceration.  Injury was confined to the ankle down.  He feels pain and some swelling as well as a little bit of numbness.  He is able to wiggle his toes and move his foot but it hurts to do so.  No other injuries.     Physical Exam   Triage Vital Signs: ED Triage Vitals  Encounter Vitals Group     BP 06/27/24 0157 (!) 142/73     Girls Systolic BP Percentile --      Girls Diastolic BP Percentile --      Boys Systolic BP Percentile --      Boys Diastolic BP Percentile --      Pulse Rate 06/27/24 0157 86     Resp 06/27/24 0157 18     Temp 06/27/24 0157 98.1 F (36.7 C)     Temp Source 06/27/24 0157 Oral     SpO2 06/27/24 0157 97 %     Weight --      Height --      Head Circumference --      Peak Flow --      Pain Score 06/27/24 0155 8     Pain Loc --      Pain Education --      Exclude from Growth Chart --     Most recent vital signs: Vitals:   06/27/24 0157  BP: (!) 142/73  Pulse: 86  Resp: 18  Temp: 98.1 F (36.7 C)  SpO2: 97%    General: Awake, appears uncomfortable but is not in distress. CV:  Good peripheral perfusion.  Easily palpable DP pulse in his affected foot. Resp:  Normal effort. Speaking easily and comfortably, no accessory muscle usage nor intercostal retractions.   Abd:  No distention.  Other:  Right ankle, both medial and lateral malleolus, are normal in appearance but both are tender to palpation.  No tenderness to palpation of the midfoot.  Patient reports pain when he dorsiflexes and plantar flexes his foot.  No lacerations or ecchymosis.   ED Results / Procedures / Treatments    Labs (all labs ordered are listed, but only abnormal results are displayed) Labs Reviewed - No data to display    RADIOLOGY I independently viewed and interpreted the patient's foot x-rays and I see no evidence of fracture nor dislocation.  I also read the radiologist's report, which confirmed no acute findings.   PROCEDURES:  Critical Care performed: No  Procedures    IMPRESSION / MDM / ASSESSMENT AND PLAN / ED COURSE  I reviewed the triage vital signs and the nursing notes.                              Differential diagnosis includes, but is not limited to, contusion, fracture, dislocation, sprain.  Patient's presentation is most consistent with acute complicated illness / injury requiring diagnostic workup.  Labs/studies ordered: Foot x-rays  Interventions/Medications given:  Medications  acetaminophen  (TYLENOL ) tablet 1,000 mg (has no administration in time range)  ibuprofen  (  ADVIL ) tablet 600 mg (has no administration in time range)    (Note:  hospital course my include additional interventions and/or labs/studies not listed above.)   Reassuring exam and radiographs.  Had my usual and customary musculoskeletal/contusion discussion including RICE, weightbearing as tolerated, etc.  Patient understands and agrees with the plan.  I provided follow-up information for his PCP as well as podiatry.  Work note provided for 3 days of recuperation         FINAL CLINICAL IMPRESSION(S) / ED DIAGNOSES   Final diagnoses:  Contusion of right foot, initial encounter     Rx / DC Orders   ED Discharge Orders     None        Note:  This document was prepared using Dragon voice recognition software and may include unintentional dictation errors.   Gordan Huxley, MD 06/27/24 [phone]

## 2024-06-27 NOTE — ED Notes (Signed)
 Patient supervisor is now stating they want to do the Workers Comp drug screen.

## 2024-06-27 NOTE — ED Notes (Signed)
 Workers comp drug screen supplies gathered. Specimen bag, specimen container, chain of custody form, and consent form. Patient presented his [place]  drivers license as form of identification. Consent form explained to pt. Pt gave verbal consent and signed the consent form. Chain of custody form filled out by this tech and signed by pt. Dwayne, EDT as a witness. Hand hygiene performed and gloves donned. Specimen swab packaged opened and swab was taken out. Pt was instructed to place swab under his tongue and not to chew, bite or suck on swab or talk with swab in his mouth. The 10 minutes passed and window on swab did not turn blue. Hand hygiene performed and gloves donned. Swab was removed from pt mouth and placed in specimen container. Patient initialed and dated the label and lable was placed on top of specimen container. Specimen was placed on front pocket of specimen bag and chain of custody was placed in the back pocket. Bag was sealed and container seal sticker placed over bag. Patient was given his copy of chain of custody form and pt supervisor was given his copy. Specimen was walked to the lab by this tech and placed in the refrigerator. Chain of custody form was faxed to [phone]. Chain of custody form and consent form placed in medical record bin.

## 2024-06-27 NOTE — ED Notes (Signed)
 Patient given discharge instructions, sized for crutches, and ace wrap placed. Patient stable and ambulatory with crutches on discharge.

## 2024-06-27 NOTE — ED Triage Notes (Addendum)
 Patient here w/ pain to the R foot after foot got stuck between two pallets that were fully loaded while at his job just prior to arrival. Patient unable to bear weight due to the pain. Able to move toes upon assessment.

## 2024-12-11 ENCOUNTER — Ambulatory Visit (HOSPITAL_COMMUNITY)
Admission: RE | Admit: 2024-12-11 | Discharge: 2024-12-11 | Disposition: A | Source: Ambulatory Visit | Attending: Surgery | Admitting: Surgery

## 2024-12-11 ENCOUNTER — Other Ambulatory Visit (HOSPITAL_COMMUNITY): Payer: Self-pay | Admitting: Internal Medicine

## 2024-12-11 DIAGNOSIS — M79604 Pain in right leg: Secondary | ICD-10-CM
